# Patient Record
Sex: Male | Born: 1950 | Race: White | Hispanic: No | Marital: Single | Smoking: Never smoker
Health system: Southern US, Community
[De-identification: ages and names within clinical notes are randomized; demographics above are authoritative.]

## PROBLEM LIST (undated history)

## (undated) DIAGNOSIS — F329 Major depressive disorder, single episode, unspecified: Secondary | ICD-10-CM

## (undated) DIAGNOSIS — Z923 Personal history of irradiation: Secondary | ICD-10-CM

## (undated) DIAGNOSIS — G4700 Insomnia, unspecified: Principal | ICD-10-CM

## (undated) DIAGNOSIS — C099 Malignant neoplasm of tonsil, unspecified: Secondary | ICD-10-CM

## (undated) DIAGNOSIS — F32A Depression, unspecified: Secondary | ICD-10-CM

## (undated) HISTORY — DX: Insomnia, unspecified: G47.00

## (undated) HISTORY — DX: Malignant neoplasm of tonsil, unspecified: C09.9

## (undated) HISTORY — DX: Personal history of irradiation: Z92.3

---

## 1999-07-13 HISTORY — PX: HERNIA REPAIR: SHX51

## 2014-03-06 ENCOUNTER — Other Ambulatory Visit: Payer: Self-pay | Admitting: Otolaryngology

## 2014-03-06 DIAGNOSIS — R220 Localized swelling, mass and lump, head: Secondary | ICD-10-CM

## 2014-03-06 DIAGNOSIS — R221 Localized swelling, mass and lump, neck: Principal | ICD-10-CM

## 2014-03-08 ENCOUNTER — Other Ambulatory Visit: Payer: Self-pay | Admitting: Family Medicine

## 2014-03-08 ENCOUNTER — Ambulatory Visit
Admission: RE | Admit: 2014-03-08 | Discharge: 2014-03-08 | Disposition: A | Discharge status: Home / Self Care | Payer: BC Managed Care – PPO | Source: Ambulatory Visit | Attending: Family Medicine | Admitting: Family Medicine

## 2014-03-08 DIAGNOSIS — M25421 Effusion, right elbow: Secondary | ICD-10-CM

## 2014-03-15 ENCOUNTER — Ambulatory Visit
Admission: RE | Admit: 2014-03-15 | Discharge: 2014-03-15 | Disposition: A | Discharge status: Home / Self Care | Payer: BC Managed Care – PPO | Source: Ambulatory Visit | Attending: Otolaryngology | Admitting: Otolaryngology

## 2014-03-15 DIAGNOSIS — R221 Localized swelling, mass and lump, neck: Principal | ICD-10-CM

## 2014-03-15 DIAGNOSIS — R220 Localized swelling, mass and lump, head: Secondary | ICD-10-CM

## 2014-03-15 MED ORDER — IOHEXOL 300 MG/ML  SOLN
75.0000 mL | Freq: Once | INTRAMUSCULAR | Status: AC | PRN
  Administered 2014-03-15: 75 mL via INTRAVENOUS

## 2014-03-20 ENCOUNTER — Other Ambulatory Visit: Payer: Self-pay | Admitting: Otolaryngology

## 2014-03-20 ENCOUNTER — Other Ambulatory Visit (HOSPITAL_COMMUNITY)
Admission: RE | Admit: 2014-03-20 | Discharge: 2014-03-20 | Disposition: A | Discharge status: Home / Self Care | Payer: BC Managed Care – PPO | Source: Ambulatory Visit | Attending: Otolaryngology | Admitting: Otolaryngology

## 2014-03-20 DIAGNOSIS — R221 Localized swelling, mass and lump, neck: Secondary | ICD-10-CM | POA: Diagnosis not present

## 2014-03-20 DIAGNOSIS — R220 Localized swelling, mass and lump, head: Secondary | ICD-10-CM | POA: Diagnosis present

## 2014-03-21 ENCOUNTER — Other Ambulatory Visit (HOSPITAL_COMMUNITY): Payer: Self-pay | Admitting: Otolaryngology

## 2014-03-21 DIAGNOSIS — R590 Localized enlarged lymph nodes: Secondary | ICD-10-CM

## 2014-03-21 DIAGNOSIS — R599 Enlarged lymph nodes, unspecified: Secondary | ICD-10-CM

## 2014-03-23 ENCOUNTER — Encounter (HOSPITAL_COMMUNITY): Admission: RE | Admit: 2014-03-23 | Payer: BC Managed Care – PPO | Source: Ambulatory Visit

## 2014-03-29 ENCOUNTER — Encounter (HOSPITAL_COMMUNITY)
Admission: RE | Admit: 2014-03-29 | Discharge: 2014-03-29 | Disposition: A | Discharge status: Home / Self Care | Payer: BC Managed Care – PPO | Source: Ambulatory Visit | Attending: Otolaryngology | Admitting: Otolaryngology

## 2014-03-29 DIAGNOSIS — R599 Enlarged lymph nodes, unspecified: Secondary | ICD-10-CM | POA: Diagnosis present

## 2014-03-29 DIAGNOSIS — R590 Localized enlarged lymph nodes: Secondary | ICD-10-CM

## 2014-03-29 LAB — GLUCOSE, CAPILLARY: GLUCOSE-CAPILLARY: 89 mg/dL (ref 70–99)

## 2014-03-29 MED ORDER — FLUDEOXYGLUCOSE F - 18 (FDG) INJECTION
9.7000 | Freq: Once | INTRAVENOUS | Status: AC | PRN
  Administered 2014-03-29: 9.7 via INTRAVENOUS

## 2014-04-11 DIAGNOSIS — C099 Malignant neoplasm of tonsil, unspecified: Secondary | ICD-10-CM

## 2014-04-11 HISTORY — DX: Malignant neoplasm of tonsil, unspecified: C09.9

## 2014-04-12 ENCOUNTER — Other Ambulatory Visit (HOSPITAL_COMMUNITY): Payer: Self-pay | Admitting: Otolaryngology

## 2014-04-12 ENCOUNTER — Encounter (HOSPITAL_COMMUNITY): Payer: Self-pay

## 2014-04-12 ENCOUNTER — Encounter (HOSPITAL_COMMUNITY)
Admission: RE | Admit: 2014-04-12 | Discharge: 2014-04-12 | Disposition: A | Discharge status: Home / Self Care | Payer: BC Managed Care – PPO | Source: Ambulatory Visit | Attending: Otolaryngology | Admitting: Otolaryngology

## 2014-04-12 DIAGNOSIS — R221 Localized swelling, mass and lump, neck: Secondary | ICD-10-CM | POA: Diagnosis present

## 2014-04-12 DIAGNOSIS — Z01812 Encounter for preprocedural laboratory examination: Secondary | ICD-10-CM | POA: Insufficient documentation

## 2014-04-12 HISTORY — DX: Depression, unspecified: F32.A

## 2014-04-12 HISTORY — DX: Major depressive disorder, single episode, unspecified: F32.9

## 2014-04-12 LAB — CBC
HCT: 37.7 % — ABNORMAL LOW (ref 39.0–52.0)
Hemoglobin: 13.2 g/dL (ref 13.0–17.0)
MCH: 33.1 pg (ref 26.0–34.0)
MCHC: 35 g/dL (ref 30.0–36.0)
MCV: 94.5 fL (ref 78.0–100.0)
Platelets: 189 10*3/uL (ref 150–400)
RBC: 3.99 MIL/uL — AB (ref 4.22–5.81)
RDW: 11.9 % (ref 11.5–15.5)
WBC: 5.7 10*3/uL (ref 4.0–10.5)

## 2014-04-12 NOTE — Progress Notes (Signed)
Pt's PCP is Dr. Dorthy Cooler at [facility].

## 2014-04-12 NOTE — Progress Notes (Signed)
No pre-op orders in EPIC. Called Dr. Victorio Palm office to request orders, spoke with [facility]

## 2014-04-12 NOTE — Pre-Procedure Instructions (Signed)
[name]  04/12/2014   Your procedure is scheduled on:  Friday, April 19, 2014 at 7:30 AM.   Report to [facility] Entrance "A" Admitting Office at 5:30 AM.   Call this number if you have problems the morning of surgery: [phone]   Remember:   Do not eat food or drink liquids after midnight Thursday, 04/18/14.   Take these medicines the morning of surgery with A SIP OF WATER: NONE  Stop Ibuprofen as of today   Do not wear jewelry.  Do not wear lotions, powders, or cologne. You may wear deodorant.  Men may shave face and neck.  Do not bring valuables to the hospital.  [facility] is not responsible                  for any belongings or valuables.               Contacts, dentures or bridgework may not be worn into surgery.  Leave suitcase in the car. After surgery it may be brought to your room.  For patients admitted to the hospital, discharge time is determined by your                treatment team.               Patients discharged the day of surgery will not be allowed to drive home.    Special Instructions: [place] - Preparing for Surgery  Before surgery, you can play an important role.  Because skin is not sterile, your skin needs to be as free of germs as possible.  You can reduce the number of germs on you skin by washing with CHG (chlorahexidine gluconate) soap before surgery.  CHG is an antiseptic cleaner which kills germs and bonds with the skin to continue killing germs even after washing.  Please DO NOT use if you have an allergy to CHG or antibacterial soaps.  If your skin becomes reddened/irritated stop using the CHG and inform your nurse when you arrive at Short Stay.  Do not shave (including legs and underarms) for at least 48 hours prior to the first CHG shower.  You may shave your face.  Please follow these instructions carefully:   1.  Shower with CHG Soap the night before surgery and the                                morning of  Surgery.  2.  If you choose to wash your hair, wash your hair first as usual with your       normal shampoo.  3.  After you shampoo, rinse your hair and body thoroughly to remove the                      Shampoo.  4.  Use CHG as you would any other liquid soap.  You can apply chg directly       to the skin and wash gently with scrungie or a clean washcloth.  5.  Apply the CHG Soap to your body ONLY FROM THE NECK DOWN.        Do not use on open wounds or open sores.  Avoid contact with your eyes, ears, mouth and genitals (private parts).  Wash genitals (private parts) with your normal soap.  6.  Wash thoroughly, paying special attention to the area where your surgery  will be performed.  7.  Thoroughly rinse your body with warm water from the neck down.  8.  DO NOT shower/wash with your normal soap after using and rinsing off       the CHG Soap.  9.  Pat yourself dry with a clean towel.            10.  Wear clean pajamas.            11.  Place clean sheets on your bed the night of your first shower and do not        sleep with pets.  Day of Surgery  Do not apply any lotions the morning of surgery.  Please wear clean clothes to the hospital/surgery center.     Please read over the following fact sheets that you were given: Pain Booklet, Coughing and Deep Breathing and Surgical Site Infection Prevention

## 2014-04-19 ENCOUNTER — Encounter (HOSPITAL_COMMUNITY)
Admission: RE | Disposition: A | Discharge status: Home / Self Care | Payer: Self-pay | Source: Ambulatory Visit | Attending: Otolaryngology

## 2014-04-19 ENCOUNTER — Ambulatory Visit (HOSPITAL_COMMUNITY): Payer: BC Managed Care – PPO | Admitting: Certified Registered"

## 2014-04-19 ENCOUNTER — Ambulatory Visit (HOSPITAL_COMMUNITY)
Admission: RE | Admit: 2014-04-19 | Discharge: 2014-04-19 | Disposition: A | Discharge status: Home / Self Care | Payer: BC Managed Care – PPO | Source: Ambulatory Visit | Attending: Otolaryngology | Admitting: Otolaryngology

## 2014-04-19 ENCOUNTER — Encounter (HOSPITAL_COMMUNITY): Payer: Self-pay | Admitting: *Deleted

## 2014-04-19 ENCOUNTER — Encounter (HOSPITAL_COMMUNITY): Payer: BC Managed Care – PPO | Admitting: Certified Registered"

## 2014-04-19 DIAGNOSIS — C099 Malignant neoplasm of tonsil, unspecified: Secondary | ICD-10-CM | POA: Diagnosis not present

## 2014-04-19 DIAGNOSIS — F1210 Cannabis abuse, uncomplicated: Secondary | ICD-10-CM | POA: Insufficient documentation

## 2014-04-19 DIAGNOSIS — R221 Localized swelling, mass and lump, neck: Secondary | ICD-10-CM | POA: Diagnosis present

## 2014-04-19 DIAGNOSIS — F329 Major depressive disorder, single episode, unspecified: Secondary | ICD-10-CM | POA: Diagnosis not present

## 2014-04-19 HISTORY — PX: TONSILLECTOMY: SHX5217

## 2014-04-19 HISTORY — PX: DIRECT LARYNGOSCOPY: SHX5326

## 2014-04-19 SURGERY — LARYNGOSCOPY, DIRECT
Anesthesia: General | Site: Mouth | Laterality: Right

## 2014-04-19 MED ORDER — OXYCODONE HCL 5 MG PO TABS: 5.0000 mg | ORAL_TABLET | Freq: Once | ORAL | Status: DC | PRN

## 2014-04-19 MED ORDER — PROPOFOL 10 MG/ML IV BOLUS
INTRAVENOUS | Status: AC
  Filled 2014-04-19: qty 20

## 2014-04-19 MED ORDER — HYDROCODONE-ACETAMINOPHEN 7.5-325 MG/15ML PO SOLN: 10.0000 mL | ORAL | Status: DC | PRN

## 2014-04-19 MED ORDER — ONDANSETRON HCL 4 MG/2ML IJ SOLN
INTRAMUSCULAR | Status: AC
  Filled 2014-04-19: qty 2

## 2014-04-19 MED ORDER — GLYCOPYRROLATE 0.2 MG/ML IJ SOLN
INTRAMUSCULAR | Status: DC | PRN
  Administered 2014-04-19: 0.4 mg via INTRAVENOUS

## 2014-04-19 MED ORDER — DEXAMETHASONE SODIUM PHOSPHATE 4 MG/ML IJ SOLN
INTRAMUSCULAR | Status: DC | PRN
  Administered 2014-04-19: 8 mg via INTRAVENOUS

## 2014-04-19 MED ORDER — SODIUM CHLORIDE 0.9 % IJ SOLN
INTRAMUSCULAR | Status: AC
  Filled 2014-04-19: qty 10

## 2014-04-19 MED ORDER — ONDANSETRON HCL 4 MG/2ML IJ SOLN: 4.0000 mg | Freq: Once | INTRAMUSCULAR | Status: DC | PRN

## 2014-04-19 MED ORDER — PROPOFOL 10 MG/ML IV BOLUS
INTRAVENOUS | Status: DC | PRN
  Administered 2014-04-19: 180 mg via INTRAVENOUS

## 2014-04-19 MED ORDER — ONDANSETRON HCL 4 MG/2ML IJ SOLN
INTRAMUSCULAR | Status: DC | PRN
Start: 2014-04-19 — End: 2014-04-19
  Administered 2014-04-19: 4 mg via INTRAVENOUS

## 2014-04-19 MED ORDER — HYDROMORPHONE HCL 1 MG/ML IJ SOLN: 0.2500 mg | INTRAMUSCULAR | Status: DC | PRN

## 2014-04-19 MED ORDER — GLYCOPYRROLATE 0.2 MG/ML IJ SOLN
INTRAMUSCULAR | Status: AC
  Filled 2014-04-19: qty 2

## 2014-04-19 MED ORDER — ROCURONIUM BROMIDE 100 MG/10ML IV SOLN
INTRAVENOUS | Status: DC | PRN
  Administered 2014-04-19: 50 mg via INTRAVENOUS

## 2014-04-19 MED ORDER — DEXAMETHASONE SODIUM PHOSPHATE 4 MG/ML IJ SOLN
INTRAMUSCULAR | Status: AC
  Filled 2014-04-19: qty 2

## 2014-04-19 MED ORDER — LIDOCAINE HCL (CARDIAC) 20 MG/ML IV SOLN
INTRAVENOUS | Status: AC
  Filled 2014-04-19: qty 5

## 2014-04-19 MED ORDER — CEFAZOLIN SODIUM-DEXTROSE 2-3 GM-% IV SOLR
2000.0000 mg | Freq: Once | INTRAVENOUS | Status: AC
  Administered 2014-04-19: 2 g via INTRAVENOUS
  Filled 2014-04-19: qty 50

## 2014-04-19 MED ORDER — MIDAZOLAM HCL 2 MG/2ML IJ SOLN
INTRAMUSCULAR | Status: AC
  Filled 2014-04-19: qty 2

## 2014-04-19 MED ORDER — ROCURONIUM BROMIDE 50 MG/5ML IV SOLN
INTRAVENOUS | Status: AC
  Filled 2014-04-19: qty 1

## 2014-04-19 MED ORDER — EPINEPHRINE HCL (NASAL) 0.1 % NA SOLN
NASAL | Status: AC
  Filled 2014-04-19: qty 30

## 2014-04-19 MED ORDER — NEOSTIGMINE METHYLSULFATE 10 MG/10ML IV SOLN
INTRAVENOUS | Status: DC | PRN
  Administered 2014-04-19: 3 mg via INTRAVENOUS

## 2014-04-19 MED ORDER — OXYCODONE HCL 5 MG/5ML PO SOLN: 5.0000 mg | Freq: Once | ORAL | Status: DC | PRN

## 2014-04-19 MED ORDER — LACTATED RINGERS IV SOLN
INTRAVENOUS | Status: DC | PRN
  Administered 2014-04-19: 07:00:00 via INTRAVENOUS

## 2014-04-19 MED ORDER — LIDOCAINE HCL (CARDIAC) 20 MG/ML IV SOLN
INTRAVENOUS | Status: DC | PRN
  Administered 2014-04-19: 30 mg via INTRAVENOUS

## 2014-04-19 MED ORDER — SUCCINYLCHOLINE CHLORIDE 20 MG/ML IJ SOLN
INTRAMUSCULAR | Status: AC
  Filled 2014-04-19: qty 1

## 2014-04-19 MED ORDER — CEFAZOLIN SODIUM-DEXTROSE 2-3 GM-% IV SOLR
INTRAVENOUS | Status: AC
  Filled 2014-04-19: qty 50

## 2014-04-19 MED ORDER — AMOXICILLIN-POT CLAVULANATE 250-62.5 MG/5ML PO SUSR: 10.0000 mL | Freq: Two times a day (BID) | ORAL | Status: DC

## 2014-04-19 MED ORDER — MIDAZOLAM HCL 5 MG/5ML IJ SOLN
INTRAMUSCULAR | Status: DC | PRN
  Administered 2014-04-19: 2 mg via INTRAVENOUS

## 2014-04-19 MED ORDER — SUFENTANIL CITRATE 50 MCG/ML IV SOLN
INTRAVENOUS | Status: DC | PRN
  Administered 2014-04-19: 20 ug via INTRAVENOUS

## 2014-04-19 MED ORDER — NEOSTIGMINE METHYLSULFATE 10 MG/10ML IV SOLN
INTRAVENOUS | Status: AC
  Filled 2014-04-19: qty 1

## 2014-04-19 MED ORDER — 0.9 % SODIUM CHLORIDE (POUR BTL) OPTIME
TOPICAL | Status: DC | PRN
  Administered 2014-04-19: 1000 mL

## 2014-04-19 MED ORDER — SUFENTANIL CITRATE 50 MCG/ML IV SOLN
INTRAVENOUS | Status: AC
  Filled 2014-04-19: qty 1

## 2014-04-19 SURGICAL SUPPLY — 38 items
BALLN PULM 15 16.5 18X75 (BALLOONS)
BALLOON PULM 15 16.5 18X75 (BALLOONS) IMPLANT
CANISTER SUCTION 2500CC (MISCELLANEOUS) ×3 IMPLANT
CATH ROBINSON RED A/P 12FR (CATHETERS) IMPLANT
CLEANER TIP ELECTROSURG 2X2 (MISCELLANEOUS) ×3 IMPLANT
COAGULATOR SUCT SWTCH 10FR 6 (ELECTROSURGICAL) ×3 IMPLANT
CONT SPEC 4OZ CLIKSEAL STRL BL (MISCELLANEOUS) ×6 IMPLANT
COVER MAYO STAND STRL (DRAPES) IMPLANT
COVER TABLE BACK 60X90 (DRAPES) IMPLANT
DRAPE PROXIMA HALF (DRAPES) ×3 IMPLANT
DRSG TELFA 3X8 NADH (GAUZE/BANDAGES/DRESSINGS) ×3 IMPLANT
ELECT COATED BLADE 2.86 ST (ELECTRODE) ×3 IMPLANT
ELECT REM PT RETURN 9FT ADLT (ELECTROSURGICAL) ×3
ELECT REM PT RETURN 9FT PED (ELECTROSURGICAL)
ELECTRODE REM PT RETRN 9FT PED (ELECTROSURGICAL) IMPLANT
ELECTRODE REM PT RTRN 9FT ADLT (ELECTROSURGICAL) ×2 IMPLANT
GAUZE SPONGE 4X4 16PLY XRAY LF (GAUZE/BANDAGES/DRESSINGS) ×3 IMPLANT
GLOVE BIOGEL M 7.0 STRL (GLOVE) ×6 IMPLANT
GLOVE SURG SS PI 7.0 STRL IVOR (GLOVE) ×6 IMPLANT
GOWN STRL REUS W/ TWL LRG LVL3 (GOWN DISPOSABLE) ×4 IMPLANT
GOWN STRL REUS W/TWL LRG LVL3 (GOWN DISPOSABLE) ×2
GUARD TEETH (MISCELLANEOUS) ×3 IMPLANT
KIT BASIN OR (CUSTOM PROCEDURE TRAY) ×3 IMPLANT
KIT ROOM TURNOVER OR (KITS) ×3 IMPLANT
MARKER SKIN DUAL TIP RULER LAB (MISCELLANEOUS) IMPLANT
NEEDLE 18GX1X1/2 (RX/OR ONLY) (NEEDLE) ×3 IMPLANT
NS IRRIG 1000ML POUR BTL (IV SOLUTION) ×3 IMPLANT
PACK SURGICAL SETUP 50X90 (CUSTOM PROCEDURE TRAY) ×3 IMPLANT
PAD ARMBOARD 7.5X6 YLW CONV (MISCELLANEOUS) ×6 IMPLANT
PATTIES SURGICAL .5 X3 (DISPOSABLE) IMPLANT
PENCIL BUTTON HOLSTER BLD 10FT (ELECTRODE) ×3 IMPLANT
SPONGE TONSIL 1 RF SGL (DISPOSABLE) ×3 IMPLANT
SURGILUBE 2OZ TUBE FLIPTOP (MISCELLANEOUS) IMPLANT
SYR BULB 3OZ (MISCELLANEOUS) ×3 IMPLANT
TOWEL OR 17X24 6PK STRL BLUE (TOWEL DISPOSABLE) ×6 IMPLANT
TUBE CONNECTING 12X1/4 (SUCTIONS) ×3 IMPLANT
TUBE SALEM SUMP 16 FR W/ARV (TUBING) ×3 IMPLANT
WATER STERILE IRR 1000ML POUR (IV SOLUTION) ×3 IMPLANT

## 2014-04-19 NOTE — Brief Op Note (Signed)
04/19/2014  8:26 AM  PATIENT:  [name]  63 y.o. male  PRE-OPERATIVE DIAGNOSIS:  RIGHT NECK MASS  POST-OPERATIVE DIAGNOSIS:  RIGHT NECK MASS  PROCEDURE:  Procedure(s): DIRECT LARYNGOSCOPY AND BIOPSY (N/A) RIGHT SIDE TONSILLECTOMY (Right)  SURGEON:  Surgeon(s) and Role:    * Jerrell Belfast, MD - Primary  PHYSICIAN ASSISTANT:   ASSISTANTS: none   ANESTHESIA:   general  EBL:     BLOOD ADMINISTERED:none  DRAINS: none   LOCAL MEDICATIONS USED:  NONE  SPECIMEN:  Source of Specimen:  Rt BOT and Tonsil  DISPOSITION OF SPECIMEN:  PATHOLOGY  COUNTS:  YES  TOURNIQUET:  * No tourniquets in log *  DICTATION: .Other Dictation: Dictation Number [id number]  PLAN OF CARE: Discharge to home after PACU  PATIENT DISPOSITION:  PACU - hemodynamically stable.   Delay start of Pharmacological VTE agent (>24hrs) due to surgical blood loss or risk of bleeding: not applicable

## 2014-04-19 NOTE — Anesthesia Postprocedure Evaluation (Signed)
  Anesthesia Post-op Note  Patient: [name]  Procedure(s) Performed: Procedure(s): DIRECT LARYNGOSCOPY AND BIOPSY (N/A) RIGHT SIDE TONSILLECTOMY (Right)  Patient Location: PACU  Anesthesia Type:General  Level of Consciousness: awake, alert  and oriented  Airway and Oxygen Therapy: Patient Spontanous Breathing  Post-op Pain: mild  Post-op Assessment: Post-op Vital signs reviewed, Patient's Cardiovascular Status Stable, Respiratory Function Stable, Patent Airway and Pain level controlled  Post-op Vital Signs: stable  Last Vitals:  Filed Vitals:   04/19/14 0922  BP: 137/75  Pulse: 53  Temp:   Resp: 15    Complications: No apparent anesthesia complications

## 2014-04-19 NOTE — Anesthesia Procedure Notes (Signed)
Procedure Name: Intubation Date/Time: 04/19/2014 7:46 AM Performed by: Melina Copa, Nhu Glasby R Pre-anesthesia Checklist: Patient identified, Emergency Drugs available, Suction available, Patient being monitored and Timeout performed Patient Re-evaluated:Patient Re-evaluated prior to inductionOxygen Delivery Method: Circle system utilized Preoxygenation: Pre-oxygenation with 100% oxygen Intubation Type: IV induction Ventilation: Mask ventilation without difficulty Laryngoscope Size: Mac and 4 Grade View: Grade I Tube type: Oral Tube size: 7.0 mm Number of attempts: 1 Airway Equipment and Method: Stylet Placement Confirmation: ETT inserted through vocal cords under direct vision,  positive ETCO2 and breath sounds checked- equal and bilateral Secured at: 21 cm Tube secured with: Tape Dental Injury: Teeth and Oropharynx as per pre-operative assessment

## 2014-04-19 NOTE — Op Note (Signed)
[name], [name]              ACCOUNT NO.:  [id number]  MEDICAL RECORD NO.:  [id number]  LOCATION:  [facility]                         FACILITY:  [facility]  PHYSICIAN:  Early Chars. Wilburn Cornelia, M.D.DATE OF BIRTH:  05/22/1951  DATE OF PROCEDURE:  04/19/2014 DATE OF DISCHARGE:                              OPERATIVE REPORT   PREOPERATIVE DIAGNOSIS:  Right superior neck mass suspicious for possible metastatic disease.  POSTOPERATIVE DIAGNOSIS:  Right superior neck mass suspicious for possible metastatic disease.  INDICATION FOR SURGERY:  Right superior neck mass suspicious for possible metastatic disease.  SURGICAL PROCEDURE:  Direct laryngoscopy, biopsy, and right tonsillectomy.  ANESTHESIA:  General endotracheal.  SURGEON:  Early Chars. Wilburn Cornelia, MD  COMPLICATIONS:  None.  ESTIMATED BLOOD LOSS:  Less than 50 mL.  The patient transferred from the operating room to the recovery room in stable condition.  BRIEF HISTORY:  The patient is a 63 year old white male, referred to our office for evaluation of an 8 month history of large right lateral neck mass.  Workup including fine-needle aspiration was suspicious for possible metastatic disease and PET scan was performed, which showed significant increased activity in the right lateral neck.  Given the history and findings, concerns raised regarding metastatic carcinoma, the patient was scheduled to undergo laryngoscopy with exam and biopsy as well as right tonsillectomy to identify a primary source for possible metastatic carcinoma.  The risks and benefits of the procedure were discussed in detail, and the patient understood and concurred with our plan for surgery which was scheduled on elective basis at [facility] on April 19, 2014.  .  DESCRIPTION OF PROCEDURE:  The patient was brought to the operating room, placed in supine position on the operating table.  General endotracheal anesthesia was established without difficulty.   When the patient was adequately anesthetized, he was positioned on the operating table and prepped and draped in sterile fashion.  Time-out was obtained and the patient was prepared for surgery,  A direct laryngoscopy was then performed.  A tooth guard was inserted. Dedo laryngoscope was used to examine the oral cavity, oropharynx, base of tongue, supraglottic larynx and hypopharynx.  There was no evidence of mucosal ulcer, mass or lesion.  Several cup forceps biopsies were obtained of the right vallecular lymphoid tissue and base of tongue, and these were sent to Pathology for gross and microscopic evaluation. There was minimal bleeding.  The oropharynx was irrigated and suctioned.  Tonsillectomy was then performed.  A Crowe-Davis mouth gag was inserted without difficulty.  There were no loose or broken teeth.  Dissection on the right-hand side was begun using Bovie electrocautery and dissecting in subcapsular fashion removing the entire right tonsil from superior pole to tongue base in a subcapsular fashion.  The tonsil appeared normal.  There was no evidence of mass or ulcer.  The tonsil was sent as a separate biopsy to Pathology for gross microscopic evaluation.  The tonsillar fossa was gently abraded with a dry tonsil sponge.  There were several small areas of point hemorrhage which were then cauterized with suction cautery.  The mouth gag was released and reapplied.  The patient's nasopharynx was then examined using a laryngeal mirror  for indirect evaluation.  There was minimal adenoidal tissue and no evidence of ulcer, tumor or mass.  The patient's mouth gag was released and reapplied with no further bleeding.  An orogastric tube was passed.  The stomach contents were aspirated.  The patient was then awakened from his anesthetic and mouth gag was removed.  He was extubated and transferred from the operating room to the recovery room in stable condition.  No complications.   Estimated blood loss less than 50 mL.          ______________________________ Early Chars. Wilburn Cornelia, M.D.     DLS/MEDQ  D:  00/51/1021  T:  04/19/2014  Job:  [id number]

## 2014-04-19 NOTE — Discharge Instructions (Signed)

## 2014-04-19 NOTE — Transfer of Care (Signed)
Immediate Anesthesia Transfer of Care Note  Patient: [name]  Procedure(s) Performed: Procedure(s): DIRECT LARYNGOSCOPY AND BIOPSY (N/A) RIGHT SIDE TONSILLECTOMY (Right)  Patient Location: PACU  Anesthesia Type:General  Level of Consciousness: awake, alert  and oriented  Airway & Oxygen Therapy: Patient Spontanous Breathing and Patient connected to nasal cannula oxygen  Post-op Assessment: Report given to PACU RN, Post -op Vital signs reviewed and stable and Patient moving all extremities  Post vital signs: Reviewed and stable  Complications: No apparent anesthesia complications

## 2014-04-19 NOTE — Anesthesia Preprocedure Evaluation (Addendum)
Anesthesia Evaluation  Patient identified by MRN, date of birth, ID band Patient awake    Reviewed: Allergy & Precautions, H&P , NPO status , Patient's Chart, lab work & pertinent test results  Airway Mallampati: II TM Distance: >3 FB Neck ROM: Full    Dental  (+) Teeth Intact, Dental Advisory Given   Pulmonary  breath sounds clear to auscultation        Cardiovascular Rhythm:Regular Rate:Normal     Neuro/Psych    GI/Hepatic   Endo/Other    Renal/GU      Musculoskeletal   Abdominal   Peds  Hematology   Anesthesia Other Findings   Reproductive/Obstetrics                           Anesthesia Physical Anesthesia Plan  ASA: II  Anesthesia Plan: General   Post-op Pain Management:    Induction: Intravenous  Airway Management Planned: Oral ETT  Additional Equipment: None  Intra-op Plan:   Post-operative Plan: Extubation in OR  Informed Consent: I have reviewed the patients History and Physical, chart, labs and discussed the procedure including the risks, benefits and alternatives for the proposed anesthesia with the patient or authorized representative who has indicated his/her understanding and acceptance.   Dental advisory given  Plan Discussed with: CRNA, Anesthesiologist and Surgeon  Anesthesia Plan Comments: (63 year old male R. Tonsillar mass  Plan GA with oral ETT  Roberts Gaudy)       Anesthesia Quick Evaluation

## 2014-04-19 NOTE — H&P (Signed)
[name] is an 63 y.o. male.   Chief Complaint: Right Neck Mass HPI: Hx of rt neck mass c/w met dz  Past Medical History  Diagnosis Date  . Depression     "years ago"    Past Surgical History  Procedure Laterality Date  . Hernia repair Left 2001    inguinal hernia    Family History  Problem Relation Age of Onset  . Dementia Mother    Social History:  reports that he has never smoked. He has never used smokeless tobacco. He reports that he drinks about 16.8 ounces of alcohol per week. He reports that he uses illicit drugs (Marijuana).  Allergies: No Known Allergies  Medications Prior to Admission  Medication Sig Dispense Refill  . ibuprofen (ADVIL,MOTRIN) 200 MG tablet Take 600 mg by mouth daily as needed (pain).        No results found for this or any previous visit (from the past 48 hour(s)). No results found.  Review of Systems  Constitutional: Negative.   HENT: Negative.   Respiratory: Negative.   Cardiovascular: Negative.   Gastrointestinal: Negative.     Blood pressure 156/78, pulse 53, temperature 98.2 F (36.8 C), temperature source Oral, resp. rate 20, height $RemoveBe'6\' 2"'DTJzrWyCu$  (1.88 m), weight 89.812 kg (198 lb), SpO2 100.00%. Physical Exam  Constitutional: He is oriented to person, place, and time. He appears well-developed and well-nourished.  Neck: Normal range of motion. Neck supple.  Cardiovascular: Normal rate.   Respiratory: Effort normal.  GI: Soft.  Musculoskeletal: Normal range of motion.  Lymphadenopathy:    He has cervical adenopathy.  Neurological: He is alert and oriented to person, place, and time.     Assessment/Plan Adm for OP DL and tonsillectomy  [name] 04/19/2014, 7:23 AM

## 2014-04-22 ENCOUNTER — Encounter (HOSPITAL_COMMUNITY): Payer: Self-pay | Admitting: Otolaryngology

## 2014-05-03 ENCOUNTER — Telehealth: Payer: Self-pay | Admitting: *Deleted

## 2014-05-03 NOTE — Telephone Encounter (Signed)
Called pt to introduce myself as the oncology nurse navigator that works with Dr. Isidore Moos, indicated that I would be joining him during his 05/08/14 consult appt with her, indicated I would tell him more about my role as a member of the Care Team when we meet. I confirmed his understanding of [place] location, arrival procedure, encouraged him to call me with any questions prior to next week's appt.  He verbalized understanding and expressed appreciation for my call.  Gayleen Orem, RN, BSN, [place] [facility] [place] [phone]

## 2014-05-07 ENCOUNTER — Encounter (HOSPITAL_COMMUNITY): Payer: Self-pay | Admitting: Dentistry

## 2014-05-07 ENCOUNTER — Other Ambulatory Visit: Payer: Self-pay | Admitting: *Deleted

## 2014-05-07 ENCOUNTER — Telehealth: Payer: Self-pay | Admitting: Hematology and Oncology

## 2014-05-07 ENCOUNTER — Ambulatory Visit (HOSPITAL_COMMUNITY): Payer: Self-pay | Admitting: Dentistry

## 2014-05-07 VITALS — BP 138/76 | HR 57 | Temp 98.0°F

## 2014-05-07 DIAGNOSIS — K0530 Chronic periodontitis, unspecified: Secondary | ICD-10-CM

## 2014-05-07 DIAGNOSIS — C099 Malignant neoplasm of tonsil, unspecified: Secondary | ICD-10-CM

## 2014-05-07 DIAGNOSIS — M264 Malocclusion, unspecified: Secondary | ICD-10-CM

## 2014-05-07 DIAGNOSIS — Z0189 Encounter for other specified special examinations: Secondary | ICD-10-CM

## 2014-05-07 DIAGNOSIS — K036 Deposits [accretions] on teeth: Secondary | ICD-10-CM

## 2014-05-07 DIAGNOSIS — K030 Excessive attrition of teeth: Secondary | ICD-10-CM

## 2014-05-07 DIAGNOSIS — M270 Developmental disorders of jaws: Secondary | ICD-10-CM

## 2014-05-07 DIAGNOSIS — K08101 Complete loss of teeth, unspecified cause, class I: Secondary | ICD-10-CM

## 2014-05-07 NOTE — Progress Notes (Addendum)
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 Extensive right cervical chain lymphadenopathy with malignant  range hypermetabolism, as above, compatible with the reported  clinical history of lymphoma.  2. There is also overall symmetric low-level hypermetabolism in the  region of the pharyngeal tonsils bilaterally. This is favored to be  physiologic, but additional lymphomatous involvement in this region  is not excluded.  Biopsies of Right Base of Tongue/Right Tonsil (if applicable) revealed:  63/3/35 Diagnosis 1. Tongue, biopsy, Right tongue base - BENIGN SQUAMOUS MUCOSA, SEE COMMENT. - NEGATIVE FOR DYSPLASIA OR MALIGNANCY. 2. Tonsil, Right - INVASIVE SQUAMOUS CELL CARCINOMA  Nutrition Status:  Weight changes: no  Swallowing status: normal  Plans, if any, for PEG tube: no  Tobacco/Marijuana/Snuff/ETOH use: He has never smoked. He has never used smokeless tobacco. He reports that he drinks about 16.8 ounces of alcohol per week. He reports that he uses illicit drugs (Marijuana).  Past/Anticipated interventions by otolaryngology, if any: Dr. Jerrell Belfast - Biopsy  Past/Anticipated interventions by medical oncology, if any: Dr. Heath Lark, 05/08/14 10:00 am appointment   Referrals yet, to any of the following?  Social Work? Through Epic due to distress screen of 5/10, patient states he does not feel he needs to be contacted at this point  Dentistry? Dr. Teena Dunk - 05/07/14  Swallowing therapy? Almyra Deforest 05/27/14  Nutrition? 05/15/14   Med/Onc? 04/28/14  PEG placement? no  SAFETY ISSUES:  Prior radiation? No  Pacemaker/ICD? No  Possible current pregnancy? Na  Is the patient on methotrexate? No  Current Complaints / other details:

## 2014-05-07 NOTE — Progress Notes (Signed)
Radiation Oncology         [phone] ________________________________  Initial outpatient Consultation  Name: [name] MRN: [id number]  Date: 05/08/2014  DOB: 19-May-1951  CC:Pcp Not In System  Jerrell Belfast, MD   REFERRING PHYSICIAN: Jerrell Belfast, MD  DIAGNOSIS: [id number] Right tonsil squamous cell carcinoma, HPV+ STAGE IVA    ICD-9-CM ICD-10-CM   1. Malignant neoplasm of tonsil 146.0 C09.9 Ambulatory referral to Social Work  2. Cancer of Right Tonsil 146.0 C09.9     HISTORY OF PRESENT ILLNESS::[name] is a 63 y.o. male who presented with mass of right neck which fluctuated back and forth in size for 6-8 months. He tried ABX in February with mild improvement. He does have a history of tonsillitis (bilaterally) several times in the past.  CT of neck on 9-4 showed bulky right neck adenopathy and the right lateral tonsil was slightly fuller than the left, but without definite tumoral enhancement.  PET (03-29-14) showed extensive right neck adenopathy and bilateral tonsil hypermetabolic activity.  Biopsy of Right Base of Tongue/Right Tonsil showed 04-19-14: Diagnosis  1. Tongue, biopsy, Right tongue base  - BENIGN SQUAMOUS MUCOSA, SEE COMMENT.  - NEGATIVE FOR DYSPLASIA OR MALIGNANCY.  2. Tonsil, Right  - INVASIVE SQUAMOUS CELL CARCINOMA  Per op note by Dr Wilburn Cornelia, right tonsil appeared grossly normal, and no evidence of mucosal tumor elsewhere. The invasive tumor demonstrated strong diffuse p16 immunostain expression.  He has seen Dr Enrique Sack with recommendations for extractions (based on anticipated RT dosing that I sent him), and the patient has been scheduled for this consult appointment with Dr. Loyal Gambler for today at 2:15 PM  Nutrition Status:  Weight changes: no  Swallowing status: normal  Tobacco/Marijuana/Snuff/ETOH use: He has never smoked tobacco. He has never used smokeless tobacco. He reports that he drinks about 16.8 ounces of alcohol per week.  He reports that he uses illicit drugs (Marijuana).  Only pain is some resolving pain from recent right tonsillectomy.  PREVIOUS RADIATION THERAPY: No  PAST MEDICAL HISTORY:  has a past medical history of Depression.    PAST SURGICAL HISTORY: Past Surgical History  Procedure Laterality Date  . Hernia repair Left 2001    inguinal hernia  . Direct laryngoscopy N/A 04/19/2014    Procedure: DIRECT LARYNGOSCOPY AND BIOPSY;  Surgeon: Jerrell Belfast, MD;  Location: [facility];  Service: ENT;  Laterality: N/A;  . Tonsillectomy Right 04/19/2014    Procedure: RIGHT SIDE TONSILLECTOMY;  Surgeon: Jerrell Belfast, MD;  Location: [facility];  Service: ENT;  Laterality: Right;    FAMILY HISTORY: family history includes Cancer in his brother; Dementia in his mother.  SOCIAL HISTORY:  reports that he has never smoked. He has never used smokeless tobacco. He reports that he drinks about 16.8 ounces of alcohol per week. He reports that he uses illicit drugs (Marijuana).  ALLERGIES: Review of patient's allergies indicates no known allergies.  MEDICATIONS:  Current Outpatient Prescriptions  Medication Sig Dispense Refill  . HYDROcodone-acetaminophen (NORCO/VICODIN) 5-325 MG per tablet Take 1 tablet by mouth every 6 (six) hours as needed for moderate pain.       No current facility-administered medications for this encounter.    REVIEW OF SYSTEMS:  Notable for that above.   PHYSICAL EXAM:  height is 6\' 2"  (1.88 m) and weight is 195 lb 9.6 oz (88.724 kg). His oral temperature is 98.9 F (37.2 C). His blood pressure is 125/74 and his pulse is 70. His respiration is 20.   General:  Alert and oriented, in no acute distress HEENT: Head is normocephalic. Pupils are equally round and reactive to light. Extraocular movements are intact. Oropharynx is unremarkable Neck:  Right neck bulky adenopathy, two large masses in levels II/III Heart: Regular in rate and rhythm with no murmurs, rubs, or gallops. Chest: Clear to  auscultation bilaterally, with no rhonchi, wheezes, or rales. Abdomen: Soft, nontender, nondistended, with no rigidity or guarding. Extremities: No cyanosis or edema. Lymphatics: see neck Skin: No concerning lesions. Musculoskeletal: symmetric strength and muscle tone throughout. Neurologic: Cranial nerves II through XII are grossly intact. No obvious focalities. Speech is fluent. Coordination is intact. Psychiatric: Judgment and insight are intact. Affect is appropriate.   ECOG = 0  0 - Asymptomatic (Fully active, able to carry on all predisease activities without restriction)  1 - Symptomatic but completely ambulatory (Restricted in physically strenuous activity but ambulatory and able to carry out work of a light or sedentary nature. For example, light housework, office work)  2 - Symptomatic, <50% in bed during the day (Ambulatory and capable of all self care but unable to carry out any work activities. Up and about more than 50% of waking hours)  3 - Symptomatic, >50% in bed, but not bedbound (Capable of only limited self-care, confined to bed or chair 50% or more of waking hours)  4 - Bedbound (Completely disabled. Cannot carry on any self-care. Totally confined to bed or chair)  5 - Death   Eustace Pen MM, Creech RH, Tormey DC, et al. [phone]). "Toxicity and response criteria of the [facility]". [place] Oncol. 5 (6): 649-55   LABORATORY DATA:  Lab Results  Component Value Date   WBC 5.7 04/12/2014   HGB 13.2 04/12/2014   HCT 37.7* 04/12/2014   MCV 94.5 04/12/2014   PLT 189 04/12/2014   CMP  No results found for this basename: na,  k,  cl,  co2,  glucose,  bun,  creatinine,  calcium,  prot,  albumin,  ast,  alt,  alkphos,  bilitot,  gfrnonaa,  gfraa      No results found for this basename: TSH      RADIOGRAPHY: No results found.    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  ETOH 3-4 beers daily.  HPV strongly +. I think he is a good candidate for concurrent chemo radiotherapy. Plan is as below:  1) To see med/onc to discuss chemotherapy today  2) Patient to see dentistry for dental evaluation/extractions if needed, counseling, and scatter guards  3) Will see social work for social support (at multidisciplinary clinic next week)  4) Will see nutrition for nutrition support  (at multidisciplinary clinic next week)  5) Will defer to Dr Alvy Bimler re: referral for PEG tube; he understands the benefits of this which we discussed in detail and is willing to proceed  6) Will refer to swallowing therapy for dysphagia prevention  7) PT to see him for risk of neck edema, deconditioning form ChRT (at multidisciplinary clinic next week)  8) Simulation ASAP, to provide dose estimates for Dr Loyal Gambler DDS.  Anticipate 7 weeks of RT - 70 Gy in 35 fractions.    9) Advised him to abstain from smoking marijuana and from drinking ETOH. While I suspect his cancer was caused by HPV, these other carcinogenic factors may have further potentiated his cancer.  He  is motivated to abstain. He knows to contact us if he needs help with this.  10) Baseline TSH.  It was a pleasure meeting the patient today. We discussed the risks, benefits, and side effects of radiotherapy. He understands salvage neck dissection can be considered if he has residual disease after treatment.  We talked in detail about acute and late effects. He understands that some of the most bothersome acute effects will be significant soreness of the mouth and throat, changes in taste, changes in salivary function, skin irritation, hair loss, dehydration, weight loss and fatigue. We talked about late effects which include but are not necessarily limited to dysphagia, hypothyroidism, dry mouth, trismus, and neck edema. No guarantees of treatment were given. A consent form  was signed and placed in the patient's medical record. The patient is enthusiastic about proceeding with treatment. I look forward to participating in the patient's care.    __________________________________________   Eppie Gibson, MD

## 2014-05-07 NOTE — Progress Notes (Signed)
DENTAL CONSULTATION  Date of Consultation:  05/07/2014 Patient Name:   [name] Date of Birth:   1951/01/26 Medical Record Number: [id number]  VITALS: BP 138/76  Pulse 57  Temp(Src) 98 F (36.7 C) (Oral)  CHIEF COMPLAINT: Patient is referred for a medically necessary pre-chemoradiation therapy dental protocol examination by Dr. Isidore Moos.  HPI: [name] is a 63 year old male recently diagnosed with squamous cell carcinoma of the right tonsil. Patient with anticipated chemoradiation therapy. Patient is now seen as part of a medically necessary pre-chemoradiation therapy dental protocol examination.  The patient currently denies acute toothaches, swellings, or abscesses. Patient was last seen by his primary dentist, Dr. Theda Belfast, for an exam and cleaning in February 2015. Patient sees the dentist on an every 6 month basis. Patient has been seeing Dr. Theda Belfast for the past 2-3 years. Patient previously had been obtaining dental treatment in [place], [place]. Patient moved to [facility] in March 2013. Patient indicates that he had implants placed approximately 4 and 8 years ago respectively. Patient without problems with his implant therapy.  PROBLEM LIST: Patient Active Problem List   Diagnosis Date Noted  . Cancer of Right Tonsil 05/07/2014  . Mass of right side of neck 04/19/2014    PMH: Past Medical History  Diagnosis Date  . Depression     "years ago"    PSH: Past Surgical History  Procedure Laterality Date  . Hernia repair Left 2001    inguinal hernia  . Direct laryngoscopy N/A 04/19/2014    Procedure: DIRECT LARYNGOSCOPY AND BIOPSY;  Surgeon: Jerrell Belfast, MD;  Location: [facility];  Service: ENT;  Laterality: N/A;  . Tonsillectomy Right 04/19/2014    Procedure: RIGHT SIDE TONSILLECTOMY;  Surgeon: Jerrell Belfast, MD;  Location: [facility];  Service: ENT;  Laterality: Right;    ALLERGIES: No Known Allergies  MEDICATIONS: Current Outpatient Prescriptions   Medication Sig Dispense Refill  . amoxicillin-clavulanate (AUGMENTIN) 250-62.5 MG/5ML suspension Take 10 mLs by mouth 2 (two) times daily.  200 mL  0  . HYDROcodone-acetaminophen (HYCET) 7.5-325 mg/15 ml solution Take 10-15 mLs by mouth every 4 (four) hours as needed for moderate pain.  473 mL  0   No current facility-administered medications for this visit.    LABS: Lab Results  Component Value Date   WBC 5.7 04/12/2014   HGB 13.2 04/12/2014   HCT 37.7* 04/12/2014   MCV 94.5 04/12/2014   PLT 189 04/12/2014   No results found for this basename: na, k, cl, co2, glucose, bun, creatinine, calcium, gfrnonaa, gfraa   No results found for this basename: INR, PROTIME   No results found for this basename: PTT    SOCIAL HISTORY: History   Social History  . Marital Status: Single    Spouse Name: N/A    Number of Children: N/A  . Years of Education: N/A   Occupational History  . Not on file.   Social History Main Topics  . Smoking status: Never Smoker   . Smokeless tobacco: Never Used  . Alcohol Use: 16.8 oz/week    28 Cans of beer per week  . Drug Use: Yes    Special: Marijuana  . Sexual Activity: Not on file   Other Topics Concern  . Not on file   Social History Narrative  . No narrative on file    FAMILY HISTORY: Family History  Problem Relation Age of Onset  . Dementia Mother     REVIEW OF SYSTEMS: Reviewed with the patient and included in  the dental record.  DENTAL HISTORY: CHIEF COMPLAINT: Patient is referred for a pre-chemoradiation therapy dental protocol examination by Dr. Isidore Moos.  HPI: [name] is a 63 year old male recently diagnosed with squamous cell carcinoma of the right tonsil. Patient with anticipated chemoradiation therapy. Patient is now seen as part of a medically necessary pre-chemoradiation therapy dental protocol examination.  The patient currently denies acute toothaches, swellings, or abscesses. Patient was last seen by his primary  dentist, Dr. Theda Belfast, for an exam and cleaning in February 2015. Patient sees the dentist on an every 6 month basis. Patient has been seeing Dr. Theda Belfast for the past 2-3 years. Patient previously had been obtaining dental treatment in [place], [place]. Patient moved to [facility] in March 2013. Patient indicates that he had implants placed approximately 4 and 8 years ago respectively. Patient without problems with his implant therapy.  DENTAL EXAMINATION: GENERAL: The patient is well-developed, well-nourished male in no acute distress. HEAD AND NECK: Patient has significant right neck lymphadenopathy. The patient has no left neck lymphadenopathy. The patient denies acute TMJ symptoms. The patient has a maximum interincisal opening of  50 mm. INTRAORAL EXAM:The patient has normal saliva. The patient has bilateral mandibular lingual tori. This patient has a palatal torus. There is no evidence of oral abscess formation.  DENTITION: The patient is missing tooth numbers 1, 5, 8, 12, 16, 17, 18, 31, and 32. There are now implants in the area of tooth numbers 5 and 12. The patient has a PFM bridge replacing tooth #8. The patient has mandibular anterior attrition.  PERIODONTAL: Patient has chronic periodontitis with plaque and calculus accumulations, selective areas of gingival recession and no obvious tooth mobility. There is incipient bone loss noted. DENTAL CARIES/SUBOPTIMAL RESTORATIONS: There are no obvious dental caries noted. Multiple areas of mandibular anterior attrition are noted. CROWN AND BRIDGE:The patient has PFM crowns on implants numbers 5 and 12. The patient has a bridge from tooth #7 through 9 replacing tooth #8. The patient has gold crowns and multiple gold inlays noted. These restorations all appear to be acceptable.  ENDODONTIC: The patient denies acute pulpitis symptoms. There are no obvious periapical radiolucencies. PROSTHODONTIC: The patient has no partial dentures.  OCCLUSION:  Patient has some supra-eruption drifting of tooth numbers 2 and 15. However, the occlusion is stable at this time.  RADIOGRAPHIC INTERPRETATION: An Orthopantogram was taken and supplemented with a full series of dental radiographs. There are multiple missing teeth. There are implants in the area of tooth numbers 5 and 12. There is incipient bone loss noted. There are multiple dental restorations noted. There is a bridge from tooth #7 through 9. There is supra-eruption drifting of the unopposed teeth into the edentulous areas.  Assessments: 1. Cancer of the right tonsil 2. Pre-chemoradiation therapy dental protocol 3. Chronic periodontitis with bone loss 4. Accretions 5. Selective areas of gingival recession 6. Mandibular anterior incisal attrition 7. Multiple missing teeth most replaced with implants and crown and bridge therapy 8. 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 I discussed the risks, benefits, and complications of various treatment options with the patient in relationship to his medical and dental conditions, anticipated chemoradiation therapy, and chemoradiation therapy side effects to include xerostomia, radiation caries, trismus, mucositis, taste changes, gum and jaw bone changes, and risks for infection and osteoradionecrosis. We discussed various treatment options to include no treatment, multiple extraction of teeth  and the primary field of radiation therapy,  alveoloplasty, pre-prosthetic surgery as indicated, periodontal therapy, dental restorations, root canal therapy, crown and bridge therapy, implant therapy, and replacement of missing teeth as indicated. The patient currently wishes to proceed with referral to an oral surgeon for a second opinion concerning extraction of teeth in the primary field of radiation therapy with alveoloplasty and  possible mandibular right torus reduction. The patient has been scheduled for this consult appointment with Dr. Loyal Gambler for tomorrow at 2:15 PM.  Patient will then follow-up with his primary dentist, Dr. Theda Belfast, for dental cleaning. Patient agreed to proceed with impressions for the fabrication of fluoride trays and scattered protection devices. Prescription for FluoriSHIELD was sent to [facility] outpatient pharmacy.  2. Discussion of findings with medical team and coordination of future medical and dental care as needed. Discussion of final ports and doses to be discussed with Dr. Loyal Gambler and Dr. Isidore Moos as indicated.  I spent in excess of 120 minutes during the conduct of this consultation and >50% of this time involved direct face-to-face encounter for counseling and/or coordination of the patient's care.    Lenn Cal, DDS

## 2014-05-07 NOTE — Telephone Encounter (Signed)
added NP appt w/NG for 10/28 @ 10am per staff message from desk nurse. per message desk nurse will contact pt. pt to arrive between 9:30am and 9:45am.

## 2014-05-07 NOTE — Patient Instructions (Signed)

## 2014-05-08 ENCOUNTER — Ambulatory Visit
Admission: RE | Admit: 2014-05-08 | Discharge: 2014-05-08 | Disposition: A | Discharge status: Home / Self Care | Payer: BC Managed Care – PPO | Source: Ambulatory Visit | Attending: Radiation Oncology | Admitting: Radiation Oncology

## 2014-05-08 ENCOUNTER — Ambulatory Visit: Payer: BC Managed Care – PPO

## 2014-05-08 ENCOUNTER — Encounter: Payer: Self-pay | Admitting: Radiation Oncology

## 2014-05-08 ENCOUNTER — Encounter: Payer: Self-pay | Admitting: Hematology and Oncology

## 2014-05-08 ENCOUNTER — Encounter: Payer: Self-pay | Admitting: *Deleted

## 2014-05-08 ENCOUNTER — Ambulatory Visit (HOSPITAL_BASED_OUTPATIENT_CLINIC_OR_DEPARTMENT_OTHER): Payer: BC Managed Care – PPO | Admitting: Hematology and Oncology

## 2014-05-08 VITALS — BP 150/76 | HR 57 | Temp 98.2°F | Resp 20 | Ht 74.0 in | Wt 195.7 lb

## 2014-05-08 VITALS — BP 125/74 | HR 70 | Temp 98.9°F | Resp 20 | Ht 74.0 in | Wt 195.6 lb

## 2014-05-08 DIAGNOSIS — Z510 Encounter for antineoplastic radiation therapy: Secondary | ICD-10-CM | POA: Diagnosis present

## 2014-05-08 DIAGNOSIS — C099 Malignant neoplasm of tonsil, unspecified: Secondary | ICD-10-CM

## 2014-05-08 DIAGNOSIS — F1210 Cannabis abuse, uncomplicated: Secondary | ICD-10-CM | POA: Diagnosis not present

## 2014-05-08 DIAGNOSIS — F1020 Alcohol dependence, uncomplicated: Secondary | ICD-10-CM

## 2014-05-08 NOTE — Assessment & Plan Note (Signed)
The patient would be an excellent candidate for concurrent chemoradiation therapy. He will need prophylactic placement of feeding tube and port prior to treatment. In the mean time, I am waiting for dental clearance before proceeding with those orders. He would need chemotherapy education class and blood work prior to this to treatment. I recommend the patient to identify care providers as he is next of kins, his brothers are away in another state. In the meantime, I will touch base with other team providers to coordinate his appointment.

## 2014-05-08 NOTE — Assessment & Plan Note (Signed)
I recommend he stop drinking as this likely the cause of his cancer.

## 2014-05-08 NOTE — Progress Notes (Signed)
[facility] CONSULT NOTE  Patient Care Team: Pcp Not In System as PCP - General Brooks Sailors, RN as Oncology Nurse Navigator Eppie Gibson, MD as Attending Physician (Radiation Oncology) Karie Mainland, RD as Dietitian (Nutrition)  CHIEF COMPLAINTS/PURPOSE OF CONSULTATION:  Tonsil cancer  HISTORY OF PRESENTING ILLNESS:  [name] 63 y.o. male is here because of newly diagnosed squamous cell carcinoma of the tonsil. I reviewed his records extensively. The patient has noted new lump in the right the neck since February of 2015. He was treated by his primary care Phill Steck with antibiotics which resolved the enlarged nodes temporarily but then it recurred. He said he started to feel enlarging lymph nodes in the right the neck again and that prompted multiple investigation.   Cancer of Right Tonsil   03/15/2014 Imaging Ct scan of neck showed bulky adenopathy in the right neck, likely metastatic squamous cell carcinoma.   03/20/2014 Procedure Accession: [id number] FNA of right neck lymph node is non-diagnostic   03/29/2014 Imaging PET/CT showed mild uptake bilateral tonsil and extensive right cervical chain lymphadenopathy with malignant range hypermetabolism   04/19/2014 Surgery Direct laryngoscopy was normal and tonsillectomy was performed   04/19/2014 Pathology Results Accession: [id number] right tonsil showed squamous cell carcinoma, P16 positive   The patient does not smoke but drinks excessively. He denies any hearing deficit. No baseline peripheral neuropathy. He is single and lives alone. He identified his brothers as his next of kin but they are in [place].  MEDICAL HISTORY:  Past Medical History  Diagnosis Date  . Depression     "years ago"    SURGICAL HISTORY: Past Surgical History  Procedure Laterality Date  . Hernia repair Left 2001    inguinal hernia  . Direct laryngoscopy N/A 04/19/2014    Procedure: DIRECT LARYNGOSCOPY AND BIOPSY;  Surgeon: Jerrell Belfast, MD;  Location: [facility];  Service: ENT;  Laterality: N/A;  . Tonsillectomy Right 04/19/2014    Procedure: RIGHT SIDE TONSILLECTOMY;  Surgeon: Jerrell Belfast, MD;  Location: [facility];  Service: ENT;  Laterality: Right;    SOCIAL HISTORY: History   Social History  . Marital Status: Single    Spouse Name: N/A    Number of Children: N/A  . Years of Education: N/A   Occupational History  . Not on file.   Social History Main Topics  . Smoking status: Never Smoker   . Smokeless tobacco: Never Used  . Alcohol Use: 16.8 oz/week    28 Cans of beer per week     Comment: 3-4 beers per night X 20 years  . Drug Use: Yes    Special: Marijuana  . Sexual Activity: Not on file   Other Topics Concern  . Not on file   Social History Narrative   The patient is single. The patient is a Geophysicist/field seismologist.   The patient moved from [place], [place] to [place] in March of 2013.   The patient is a [employer] and [employer].    FAMILY HISTORY: Family History  Problem Relation Age of Onset  . Dementia Mother   . Cancer Brother     colon    ALLERGIES:  has No Known Allergies.  MEDICATIONS:  Current Outpatient Prescriptions  Medication Sig Dispense Refill  . HYDROcodone-acetaminophen (NORCO/VICODIN) 5-325 MG per tablet Take 1 tablet by mouth every 6 (six) hours as needed for moderate pain.       No current facility-administered medications for this visit.  REVIEW OF SYSTEMS:   Constitutional: Denies fevers, chills or abnormal night sweats Eyes: Denies blurriness of vision, double vision or watery eyes Ears, nose, mouth, throat, and face: Denies mucositis or sore throat Respiratory: Denies cough, dyspnea or wheezes Cardiovascular: Denies palpitation, chest discomfort or lower extremity swelling Gastrointestinal:  Denies nausea, heartburn or change in bowel habits Skin: Denies abnormal skin rashes Lymphatics: Denies new lymphadenopathy or easy  bruising Neurological:Denies numbness, tingling or new weaknesses Behavioral/Psych: Mood is stable, no new changes  All other systems were reviewed with the patient and are negative.  PHYSICAL EXAMINATION: ECOG PERFORMANCE STATUS: 0 - Asymptomatic  Filed Vitals:   05/08/14 1054  BP: 150/76  Pulse: 57  Temp: 98.2 F (36.8 C)  Resp: 20   Filed Weights   05/08/14 1054  Weight: 195 lb 11.2 oz (88.769 kg)    GENERAL:alert, no distress and comfortable SKIN: skin color, texture, turgor are normal, no rashes or significant lesions EYES: normal, conjunctiva are pink and non-injected, sclera clear OROPHARYNX:no exudate, no erythema and lips, buccal mucosa, and tongue normal  NECK: supple, thyroid normal size, non-tender, without nodularity LYMPH:  Enlarged cervical lymphadenopathy. LUNGS: clear to auscultation and percussion with normal breathing effort HEART: regular rate & rhythm and no murmurs and no lower extremity edema ABDOMEN:abdomen soft, non-tender and normal bowel sounds Musculoskeletal:no cyanosis of digits and no clubbing  PSYCH: alert & oriented x 3 with fluent speech NEURO: no focal motor/sensory deficits  LABORATORY DATA:  I have reviewed the data as listed Lab Results  Component Value Date   WBC 5.7 04/12/2014   HGB 13.2 04/12/2014   HCT 37.7* 04/12/2014   MCV 94.5 04/12/2014   PLT 189 04/12/2014   No results found for this basename: NA, K, CL, CO2    RADIOGRAPHIC STUDIES: I have personally reviewed the radiological images as listed and agreed with the findings in the report.   ASSESSMENT & PLAN:  Cancer of Right Tonsil The patient would be an excellent candidate for concurrent chemoradiation therapy. He will need prophylactic placement of feeding tube and port prior to treatment. In the mean time, I am waiting for dental clearance before proceeding with those orders. He would need chemotherapy education class and blood work prior to this to treatment. I  recommend the patient to identify care providers as he is next of kins, his brothers are away in another state. In the meantime, I will touch base with other team providers to coordinate his appointment.  Alcoholism I recommend he stop drinking as this likely the cause of his cancer.    All questions were answered. The patient knows to call the clinic with any problems, questions or concerns. I spent 40 minutes counseling the patient face to face. The total time spent in the appointment was 60 minutes and more than 50% was on counseling.     [facility], [place], MD 05/08/2014 7:57 PM

## 2014-05-08 NOTE — Addendum Note (Signed)
Encounter addended by: Deirdre Evener, RN on: 05/08/2014  5:57 PM<BR>     Documentation filed: Charges VN

## 2014-05-08 NOTE — Progress Notes (Signed)
Please see the Nurse Progress Note in the MD Initial Consult Encounter for this patient. 

## 2014-05-09 ENCOUNTER — Telehealth: Payer: Self-pay | Admitting: *Deleted

## 2014-05-09 ENCOUNTER — Encounter (HOSPITAL_COMMUNITY): Payer: Self-pay | Admitting: Dentistry

## 2014-05-09 ENCOUNTER — Ambulatory Visit (HOSPITAL_COMMUNITY): Payer: Medicaid - Dental | Admitting: Dentistry

## 2014-05-09 VITALS — BP 143/77 | HR 61 | Temp 98.9°F

## 2014-05-09 DIAGNOSIS — Z463 Encounter for fitting and adjustment of dental prosthetic device: Secondary | ICD-10-CM

## 2014-05-09 DIAGNOSIS — C099 Malignant neoplasm of tonsil, unspecified: Secondary | ICD-10-CM

## 2014-05-09 DIAGNOSIS — Z0189 Encounter for other specified special examinations: Secondary | ICD-10-CM

## 2014-05-09 NOTE — Patient Instructions (Signed)
Patient to return to dental clinic once decision is made concerning the need for dental extractions by the oral surgeon.  Patient is to call dental medicine once this decision is determined. The patient will then be scheduled for a follow-up evaluation for insertion of fluoride trays. Patient was reminded to set up a dental cleaning appointment with his primary dentist. Dr. Enrique Sack

## 2014-05-09 NOTE — Progress Notes (Addendum)
Met with patient during his initial consult with Dr. Isidore Moos. 1. Further explained my role as his navigator, provided contact information for myself and Dr. Isidore Moos, encouraged him to contact me with questions/concerns as treatments/procedures begin. 2. Provided New Patient Information packet:  Contact information for physician and navigator  Advance Directive information ([place] blue pamphlet)  Fall Prevention Patient Safety Plan  [facility] campus map with highlight of [place] 3. Provided introductory explanation of radiation treatment including SIM planning and fitting of head mask, showed example of mask.   4. Provided photos/diagrams of feeding tube and PAC, explained their purpose. 5. Provided a tour of SIM and Tomo areas, explained treatment and arrival procedures.   He verbalized understanding of information provided.  He will be scheduled for next week's [place] for appts with dietician, SW, PT Lymphedema.  Gayleen Orem, RN, BSN, [place] at [place] [phone]

## 2014-05-09 NOTE — Telephone Encounter (Signed)
CALLED PATIENT TO INFORM OF LAB APPT. FOR 05-13-14 AND HIS APPT. WITH CARL [place] ON 05-27-14- ARRIVAL TIME - 1:45 PM, NO ANSWER WILL CALL LATER.

## 2014-05-09 NOTE — Progress Notes (Signed)
05/09/2014  Patient Name:   [name] Date of Birth:   1950-08-04 Medical Record Number: [id number]  BP 143/77  Pulse 61  Temp(Src) 98.9 F (37.2 C) (Oral)  Floyce Stakes now presents for insertion of upper and lower scatter protection devices.  PROCEDURE: Appliances were tried in and adjusted as needed. [place] Trismus device was previously  fabricated at 50 mm using 30 sticks. Postop instructions were provided and a written and verbal format concerning the use and care of appliances. All questions were answered. Patient to return to clinic for insertion of fluoride trays once decision is made concerning need for dental extractions by the oral surgeon.  The patient is to follow-up with stimulation with Dr. Isidore Moos on Monday. Patient to call if questions or problems arise before then.  Lenn Cal, DDS

## 2014-05-10 ENCOUNTER — Other Ambulatory Visit: Payer: Self-pay | Admitting: Radiation Oncology

## 2014-05-10 DIAGNOSIS — C099 Malignant neoplasm of tonsil, unspecified: Secondary | ICD-10-CM

## 2014-05-13 ENCOUNTER — Ambulatory Visit
Admission: RE | Admit: 2014-05-13 | Discharge: 2014-05-13 | Disposition: A | Discharge status: Home / Self Care | Payer: BC Managed Care – PPO | Source: Ambulatory Visit | Attending: Radiation Oncology | Admitting: Radiation Oncology

## 2014-05-13 ENCOUNTER — Encounter: Payer: Self-pay | Admitting: Radiation Oncology

## 2014-05-13 VITALS — BP 133/74 | HR 56 | Temp 98.3°F | Resp 12 | Wt 193.6 lb

## 2014-05-13 VITALS — BP 133/74 | HR 56 | Temp 98.3°F | Resp 12 | Ht 74.0 in | Wt 193.6 lb

## 2014-05-13 DIAGNOSIS — C099 Malignant neoplasm of tonsil, unspecified: Secondary | ICD-10-CM

## 2014-05-13 DIAGNOSIS — Z510 Encounter for antineoplastic radiation therapy: Secondary | ICD-10-CM | POA: Diagnosis not present

## 2014-05-13 LAB — BUN AND CREATININE (CC13)
BUN: 11.7 mg/dL (ref 7.0–26.0)
Creatinine: 1 mg/dL (ref 0.7–1.3)

## 2014-05-13 LAB — TSH CHCC: TSH: 0.756 m(IU)/L (ref 0.320–4.118)

## 2014-05-13 MED ORDER — SODIUM CHLORIDE 0.9 % IJ SOLN
10.0000 mL | Freq: Once | INTRAMUSCULAR | Status: AC
  Administered 2014-05-13: 10 mL via INTRAVENOUS

## 2014-05-13 NOTE — Progress Notes (Signed)
[name] here today for IV start prior to simulation.  His VSS.  He denies any hx. Of diabetes and denies any prior contrast reaction. His current BUN and Creat. from today is 11.7 and 1.0 respectively.  He appears nervous.  IV established in right inner forearm with a 22 gauge angiocath. Brisk blood return and flushed with 0.9 NS without any resistance.  Secured and 3 inch extension attached.  Escorted to [place].

## 2014-05-13 NOTE — Progress Notes (Signed)
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 An Aquaplast head and shoulder mask was custom fitted to the patient's anatomy. High-resolution CT axial imaging was obtained of the head and neck with contrast. I verified that the quality of the imaging is good for treatment planning. 1 Medically Necessary Treatment Device was fabricated and supervised by me: Aquaplast mask.   Treatment planning note I plan to treat the patient with helical Tomotherapy, IMRT. I plan to treat the patient's tumor and bilateral neck nodes. I plan to treat to a total dose of 70 Gray in 35  Fractions. Dose calculation was ordered from dosimetry.  IMRT planning Note  IMRT is an important modality to deliver adequate dose to the patient's at risk tissues while sparing the patient's normal structures, including the: esophagus, parotid tissue, mandible, brain stem, spinal cord, oral cavity, brachial plexus.  This justifies the use of IMRT in the patient's treatment.   Special Treatment Procedure Note:  The patient will be receiving chemotherapy concurrently. Chemotherapy heightens the risk of side effects. I have considered this during the patient's treatment planning process and will monitor the patient accordingly for side effects on a weekly basis. Concurrent chemotherapy increases the complexity of this patient's treatment and therefore this constitutes a special treatment procedure.  -----------------------------------  Eppie Gibson, MD

## 2014-05-14 ENCOUNTER — Telehealth: Payer: Self-pay | Admitting: *Deleted

## 2014-05-14 NOTE — Telephone Encounter (Signed)
Spoke with patient, reminded him of his attendance at [facility] tomorrow with an arrival time of 12:30.  He verbalized understanding.  Gayleen Orem, RN, BSN, [place] at [place] [phone]

## 2014-05-15 ENCOUNTER — Ambulatory Visit: Payer: BC Managed Care – PPO | Attending: Radiation Oncology | Admitting: Physical Therapy

## 2014-05-15 ENCOUNTER — Other Ambulatory Visit: Payer: Self-pay | Admitting: Hematology and Oncology

## 2014-05-15 ENCOUNTER — Encounter: Payer: Self-pay | Admitting: Physical Therapy

## 2014-05-15 ENCOUNTER — Encounter: Payer: Self-pay | Admitting: *Deleted

## 2014-05-15 ENCOUNTER — Ambulatory Visit: Payer: BC Managed Care – PPO | Admitting: Nutrition

## 2014-05-15 ENCOUNTER — Telehealth: Payer: Self-pay | Admitting: Hematology and Oncology

## 2014-05-15 DIAGNOSIS — R293 Abnormal posture: Secondary | ICD-10-CM | POA: Diagnosis not present

## 2014-05-15 DIAGNOSIS — Z510 Encounter for antineoplastic radiation therapy: Secondary | ICD-10-CM | POA: Diagnosis not present

## 2014-05-15 DIAGNOSIS — C099 Malignant neoplasm of tonsil, unspecified: Secondary | ICD-10-CM

## 2014-05-15 NOTE — Progress Notes (Signed)
To provide support and encouragement, care continuity and to assess for needs, met with patient during [facility]. 1. We discussed the merits of a feeding tube. 2. We discussed merits of dental extractions if deemed appropriate.  I explained that Dr. Isidore Moos will be contacting Dr. Loyal Gambler to discuss further. 3. He expressed his desire for a Product manager.  I indicated I would f/u on this for him. He understands he can contact me with questions/concerns.  Gayleen Orem, RN, BSN, [place] at [place] [phone]

## 2014-05-15 NOTE — Progress Notes (Signed)
Patient was seen during head and neck clinic.  63 year old male diagnosed with right tonsil cancer.  He is a patient of Dr. Alvy Bimler and Dr. Isidore Moos.  Past medical history includes depression.  Medications were reviewed.  Labs were reviewed.  Height: 6 feet 2 inches. Weight: 193.6 pounds on November 2. Usual body weight: 198 pounds. BMI: 24.5.  Patient has history of daily Alcohol intake.  Weight is decreased approximately 5 pounds from usual body weight.  Patient reports eating regular meals.  Nutrition diagnosis: Food and nutrition related knowledge deficit related to right tonsil cancer as evidenced by no prior need for nutrition related information.  Intervention: Patient educated to consume high-calorie, high-protein foods in 6 small meals and snacks daily. Reviewed strategies for eating if patient develops nausea and vomiting. Encouraged patient to increase hydration during treatment. Recommended patient adhere to bowel regimen to avoid constipation. Fact sheets were provided.  Questions were answered.  Teach back method used.  Contact information was given.  Monitoring, evaluation, goals: Patient will tolerate adequate calories and protein for minimal weight loss throughout treatment.  Next visit: To be schedule weekly once treatment plan determined.  **Disclaimer: This note was dictated with voice recognition software. Similar sounding words can inadvertently be transcribed and this note may contain transcription errors which may not have been corrected upon publication of note.**

## 2014-05-15 NOTE — Progress Notes (Signed)
See physical therapy note.

## 2014-05-15 NOTE — Therapy (Signed)
Physical Therapy Treatment  Patient Details  Name: [name] MRN: [id number] Date of Birth: 18-Jan-1951  Encounter Date: 05/15/2014      PT End of Session - 05/15/14 1627    Visit Number 1   Number of Visits 1   PT Start Time 7416   PT Stop Time 1400   PT Time Calculation (min) 15 min      Past Medical History  Diagnosis Date  . Depression     "years ago"    Past Surgical History  Procedure Laterality Date  . Hernia repair Left 2001    inguinal hernia  . Direct laryngoscopy N/A 04/19/2014    Procedure: DIRECT LARYNGOSCOPY AND BIOPSY;  Surgeon: Jerrell Belfast, MD;  Location: [place];  Service: ENT;  Laterality: N/A;  . Tonsillectomy Right 04/19/2014    Procedure: RIGHT SIDE TONSILLECTOMY;  Surgeon: Jerrell Belfast, MD;  Location: [place];  Service: ENT;  Laterality: Right;    There were no vitals taken for this visit.  Visit Diagnosis:  Abnormal posture - Plan: PT plan of care cert/re-cert      Subjective Assessment - 05/15/14 1600    Symptoms Pt. presented 6-8 months ago with mass on right neck that had varied in size.   Pertinent History PET scan 03/29/14 shoed extensive right cervical chain lymphadenopathy with malignant range hypermetabolism and low-level hypermetabolism in region of the pharyngeal tonsils bilaterally.  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   Currently in Pain? No/denies          [facility] PT Assessment - 05/15/14 1600    Assessment   Medical Diagnosis right tonsil invasive squamous cell carcinoma   Onset Date 03/29/14   Precautions   Precautions Other (comment)  cancer precautions   Balance Screen   Has the patient fallen in the past 6 months No   Has the patient had a decrease in activity level because of a fear of falling?  No   Is the patient reluctant to leave their home because of a fear of falling?  No   Home Environment   Family/patient expects to be discharged  to: Private residence   Living Arrangements Alone   Prior Function   Level of [place] with basic ADLs   Observation/Other Assessments   Skin Integrity intact   Posture/Postural Control   Posture/Postural Control Postural limitations   Postural Limitations --  forward head   AROM   Cervical Flexion WFL   Cervical Extension WFL   Cervical - Right Side Bend 25% loss  limited by neck lump   Cervical - Left Side Bend WFL   Cervical - Right Rotation 25% loss  limited by neck lump   Cervical - Left Rotation [facility]   Special Tests    Special Tests --  Sit to stand = 16 reps in 30 seconds   Ambulation/Gait   Ambulation/Gait Yes   Ambulation/Gait Assistance 7: Independent            Education - 05/15/14 1625    Education provided Yes   Education Details neck ROM, posture, walking, lymphedema info   Education Details Patient   Methods Explanation;Demonstration;Handout;Verbal cues   Comprehension Verbalized understanding;Returned demonstration              Plan - 05/15/14 1627    Clinical Impression Statement Pt. may benefit from therapy going forward if lymphedema develops, if neck tightness increases, if posture worsens, or  if fatigue is an issue.   Rehab Potential Good   PT Frequency Other (Comment)  Evaluation only at this time   PT Duration Other (comment)  Evaluation only at this time   PT Treatment/Interventions Patient/family education   PT Plan Pt. will be seen as needed for follow-up if problems develop as he goes through radiation treatment or after treatment is completed.        Problem List Patient Active Problem List   Diagnosis Date Noted  . Alcoholism 05/08/2014  . Cancer of Right Tonsil 05/07/2014  . Mass of right side of neck 04/19/2014            LYMPHEDEMA/ONCOLOGY QUESTIONNAIRE - 05/15/14 1600    Lymphedema Assessments Head and Neck   4 cm superior to sternal notch around neck 40.7 cm.  40.7   6 cm superior to  sternal notch around neck 39.7 cm.   8 cm superior to sternal notch around neck 41 cm.                                        Head and Neck Clinic Goals - 05/15/14 1632    Patient will be able to verbalize understanding of a home exercise program for cervical range of motion, posture, and walking.    Time 1   Period Days   Status Achieved   Patient will be able to verbalize understanding of proper sitting and standing posture.    Time 1   Period Days   Status Achieved   Patient will be able to verbalize understanding of lymphedema risk and availability of treatment for this condition.    Time 1   Period Days   Status Achieved        Skluzacek,DONNA 05/15/2014, 4:36 PM

## 2014-05-15 NOTE — Telephone Encounter (Addendum)
S/w pt confirming chemo edu class for 11/09 per 11/04 POF labs/ov, sent msg to add chemo and pt will come by to pick up schedule at next visit. Lft msg w/IR to call pt to sch Peg/Port ASAP..... KJ

## 2014-05-15 NOTE — Progress Notes (Signed)
Head & Neck Multidisciplinary Clinic Clinical Social Work  Clinical Social Work met with patient/family at head & neck multidisciplinary clinic to offer support and assess for psychosocial needs. Mr. Kilker shared he lives alone and his children live out of state.  He shared his friends are his primary support system locally.  He previously indicated his distress as a 5 at his radiation oncology visit on 05/08/14.  The patient reports his distress has lowered since that visit- he reports he is focusing on "living in the moment" and being mindful.  He feels these techniques have helped him to cope with his current situation.  Mr. Hardenbrook was interested in [facility] support programs, specifically yoga and other art programs offered through [employer].  The patient is interested in completing his healthcare advance directives.  CSW provided patient with a packet and encouraged him to follow up with CSW to complete.  ONCBCN DISTRESS SCREENING 05/08/2014  Screening Type Initial Screening  Distress experienced in past week (1-10) 5  Emotional problem type Adjusting to illness;Isolation/feeling alone  Information Concerns Type Lack of info about diagnosis    Clinical Social Work briefly discussed Clinical Social Work role and [employer] support programs/services.  Clinical Social Work encouraged patient to call with any additional questions or concerns.   Polo Riley, MSW, LCSW, OSW-C Clinical Social Worker [facility] [phone]

## 2014-05-15 NOTE — Addendum Note (Signed)
Encounter addended by: Deirdre Evener, RN on: 05/15/2014 10:43 AM<BR>     Documentation filed: Notes Section

## 2014-05-16 ENCOUNTER — Telehealth: Payer: Self-pay | Admitting: *Deleted

## 2014-05-16 ENCOUNTER — Telehealth: Payer: Self-pay | Admitting: Hematology and Oncology

## 2014-05-16 NOTE — Telephone Encounter (Signed)
s.w. pt and advised on NOV appt time change...pt ok adn aware

## 2014-05-16 NOTE — Telephone Encounter (Signed)
Per staff message and POF I have scheduled appts. Advised scheduler of appts. JMW  

## 2014-05-17 ENCOUNTER — Other Ambulatory Visit: Payer: Self-pay | Admitting: Radiology

## 2014-05-17 ENCOUNTER — Encounter (HOSPITAL_COMMUNITY): Payer: Self-pay | Admitting: Dentistry

## 2014-05-17 ENCOUNTER — Encounter (HOSPITAL_COMMUNITY): Payer: Self-pay | Admitting: Pharmacy Technician

## 2014-05-17 ENCOUNTER — Telehealth: Payer: Self-pay | Admitting: *Deleted

## 2014-05-17 ENCOUNTER — Ambulatory Visit (HOSPITAL_COMMUNITY): Payer: Medicaid - Dental | Admitting: Dentistry

## 2014-05-17 VITALS — BP 141/57 | HR 68 | Temp 98.2°F

## 2014-05-17 DIAGNOSIS — C099 Malignant neoplasm of tonsil, unspecified: Secondary | ICD-10-CM

## 2014-05-17 DIAGNOSIS — Z463 Encounter for fitting and adjustment of dental prosthetic device: Secondary | ICD-10-CM

## 2014-05-17 DIAGNOSIS — Z0189 Encounter for other specified special examinations: Secondary | ICD-10-CM

## 2014-05-17 NOTE — Telephone Encounter (Signed)
Patient called for clarification of Chemo Education appt for Monday.  I reviewed with him other appts for the week, explained what to expect on Tuesday when PAC/PEG placement scheduled, as well as on Wednesday for first chemo infusion.    Gayleen Orem, RN, BSN, [place] at [place] [phone]

## 2014-05-17 NOTE — Telephone Encounter (Signed)
Duplicate entry

## 2014-05-17 NOTE — Telephone Encounter (Signed)
Entry error

## 2014-05-17 NOTE — Progress Notes (Signed)
05/17/2014  Patient Name:   [name] Date of Birth:   29-Aug-1950 Medical Record Number: [id number]  BP 141/57 mmHg  Pulse 68  Temp(Src) 98.2 F (36.8 C) (Oral)  Floyce Stakes now presents for insertion of upper and lower fluoride trays.  PROCEDURE: Appliances were tried in and adjusted as needed. [place] Trismus device was previously fabricated. Postop instructions were provided and a written and verbal format concerning the use and care of appliances. All questions were answered. Patient to return to clinic for periodic oral examination in approximately 2-3 weeks during radiation therapy. Patient is reminded to follow-up with his primary dentist for dental cleaning prior to start of chemoradiation therapy. Patient to call if questions or problems arise before then.  Lenn Cal, DDS

## 2014-05-17 NOTE — Patient Instructions (Signed)

## 2014-05-20 ENCOUNTER — Other Ambulatory Visit: Payer: Self-pay | Admitting: *Deleted

## 2014-05-20 ENCOUNTER — Other Ambulatory Visit: Payer: BC Managed Care – PPO

## 2014-05-20 ENCOUNTER — Other Ambulatory Visit: Payer: Self-pay | Admitting: Hematology and Oncology

## 2014-05-20 DIAGNOSIS — C099 Malignant neoplasm of tonsil, unspecified: Secondary | ICD-10-CM

## 2014-05-20 NOTE — Progress Notes (Signed)
Order placed for [facility] for teaching new PEG tube.  Lurlean Leyden, RN coordinator for [facility] notified of new order.

## 2014-05-21 ENCOUNTER — Other Ambulatory Visit (HOSPITAL_BASED_OUTPATIENT_CLINIC_OR_DEPARTMENT_OTHER): Payer: BC Managed Care – PPO

## 2014-05-21 ENCOUNTER — Ambulatory Visit (HOSPITAL_COMMUNITY)
Admission: RE | Admit: 2014-05-21 | Discharge: 2014-05-21 | Disposition: A | Discharge status: Home / Self Care | Payer: BC Managed Care – PPO | Source: Ambulatory Visit | Attending: Hematology and Oncology | Admitting: Hematology and Oncology

## 2014-05-21 ENCOUNTER — Other Ambulatory Visit (HOSPITAL_COMMUNITY): Payer: Self-pay | Admitting: Dentistry

## 2014-05-21 ENCOUNTER — Telehealth: Payer: Self-pay | Admitting: Hematology and Oncology

## 2014-05-21 ENCOUNTER — Encounter (HOSPITAL_COMMUNITY): Payer: Self-pay

## 2014-05-21 ENCOUNTER — Ambulatory Visit: Payer: Self-pay | Admitting: Hematology and Oncology

## 2014-05-21 ENCOUNTER — Other Ambulatory Visit: Payer: Self-pay

## 2014-05-21 ENCOUNTER — Other Ambulatory Visit: Payer: Self-pay | Admitting: Hematology and Oncology

## 2014-05-21 ENCOUNTER — Encounter: Payer: Self-pay | Admitting: *Deleted

## 2014-05-21 ENCOUNTER — Ambulatory Visit (HOSPITAL_BASED_OUTPATIENT_CLINIC_OR_DEPARTMENT_OTHER): Payer: BC Managed Care – PPO | Admitting: Hematology and Oncology

## 2014-05-21 DIAGNOSIS — C099 Malignant neoplasm of tonsil, unspecified: Secondary | ICD-10-CM | POA: Diagnosis not present

## 2014-05-21 DIAGNOSIS — B977 Papillomavirus as the cause of diseases classified elsewhere: Secondary | ICD-10-CM

## 2014-05-21 DIAGNOSIS — F1290 Cannabis use, unspecified, uncomplicated: Secondary | ICD-10-CM | POA: Diagnosis not present

## 2014-05-21 LAB — COMPREHENSIVE METABOLIC PANEL (CC13)
ALK PHOS: 66 U/L (ref 40–150)
ALT: 16 U/L (ref 0–55)
AST: 22 U/L (ref 5–34)
Albumin: 3.9 g/dL (ref 3.5–5.0)
Anion Gap: 5 mEq/L (ref 3–11)
BILIRUBIN TOTAL: 0.54 mg/dL (ref 0.20–1.20)
BUN: 9.4 mg/dL (ref 7.0–26.0)
CO2: 30 mEq/L — ABNORMAL HIGH (ref 22–29)
CREATININE: 0.8 mg/dL (ref 0.7–1.3)
Calcium: 9.5 mg/dL (ref 8.4–10.4)
Chloride: 104 mEq/L (ref 98–109)
Glucose: 100 mg/dl (ref 70–140)
Potassium: 4.1 mEq/L (ref 3.5–5.1)
Sodium: 139 mEq/L (ref 136–145)
Total Protein: 7.1 g/dL (ref 6.4–8.3)

## 2014-05-21 LAB — PROTIME-INR
INR: 1.01 (ref 0.00–1.49)
Prothrombin Time: 13.4 seconds (ref 11.6–15.2)

## 2014-05-21 LAB — CBC WITH DIFFERENTIAL/PLATELET
BASO%: 0.7 % (ref 0.0–2.0)
BASOS ABS: 0 10*3/uL (ref 0.0–0.1)
EOS%: 0.8 % (ref 0.0–7.0)
Eosinophils Absolute: 0 10*3/uL (ref 0.0–0.5)
HEMATOCRIT: 39.5 % (ref 38.4–49.9)
HEMOGLOBIN: 13.3 g/dL (ref 13.0–17.1)
LYMPH%: 25.3 % (ref 14.0–49.0)
MCH: 32.7 pg (ref 27.2–33.4)
MCHC: 33.5 g/dL (ref 32.0–36.0)
MCV: 97.7 fL (ref 79.3–98.0)
MONO#: 0.5 10*3/uL (ref 0.1–0.9)
MONO%: 10.1 % (ref 0.0–14.0)
NEUT#: 2.9 10*3/uL (ref 1.5–6.5)
NEUT%: 63.1 % (ref 39.0–75.0)
Platelets: 191 10*3/uL (ref 140–400)
RBC: 4.05 10*6/uL — ABNORMAL LOW (ref 4.20–5.82)
RDW: 12.5 % (ref 11.0–14.6)
WBC: 4.6 10*3/uL (ref 4.0–10.3)
lymph#: 1.2 10*3/uL (ref 0.9–3.3)

## 2014-05-21 LAB — APTT: aPTT: 24 seconds (ref 24–37)

## 2014-05-21 LAB — MAGNESIUM (CC13): Magnesium: 2.5 mg/dl (ref 1.5–2.5)

## 2014-05-21 MED ORDER — CEFAZOLIN SODIUM-DEXTROSE 2-3 GM-% IV SOLR
2.0000 g | INTRAVENOUS | Status: AC
  Administered 2014-05-21: 2 g via INTRAVENOUS

## 2014-05-21 MED ORDER — MIDAZOLAM HCL 2 MG/2ML IJ SOLN
INTRAMUSCULAR | Status: AC
  Filled 2014-05-21: qty 6

## 2014-05-21 MED ORDER — OXYCODONE HCL 5 MG PO TABS: 5.0000 mg | ORAL_TABLET | ORAL | Status: DC | PRN

## 2014-05-21 MED ORDER — CEFAZOLIN SODIUM-DEXTROSE 2-3 GM-% IV SOLR
INTRAVENOUS | Status: AC
  Filled 2014-05-21: qty 50

## 2014-05-21 MED ORDER — HEPARIN SOD (PORK) LOCK FLUSH 100 UNIT/ML IV SOLN
INTRAVENOUS | Status: AC
  Filled 2014-05-21: qty 5

## 2014-05-21 MED ORDER — ONDANSETRON HCL 8 MG PO TABS: 8.0000 mg | ORAL_TABLET | Freq: Three times a day (TID) | ORAL | Status: DC | PRN

## 2014-05-21 MED ORDER — IOHEXOL 300 MG/ML  SOLN
10.0000 mL | Freq: Once | INTRAMUSCULAR | Status: AC | PRN
  Administered 2014-05-21: 15 mL

## 2014-05-21 MED ORDER — LIDOCAINE-PRILOCAINE 2.5-2.5 % EX CREA: TOPICAL_CREAM | CUTANEOUS | Status: DC

## 2014-05-21 MED ORDER — HEPARIN SOD (PORK) LOCK FLUSH 100 UNIT/ML IV SOLN
INTRAVENOUS | Status: AC | PRN
  Administered 2014-05-21: 500 [IU]

## 2014-05-21 MED ORDER — LIDOCAINE HCL 1 % IJ SOLN
INTRAMUSCULAR | Status: AC
  Filled 2014-05-21: qty 40

## 2014-05-21 MED ORDER — PROCHLORPERAZINE MALEATE 10 MG PO TABS: 10.0000 mg | ORAL_TABLET | Freq: Four times a day (QID) | ORAL | Status: DC | PRN

## 2014-05-21 MED ORDER — FENTANYL CITRATE 0.05 MG/ML IJ SOLN
INTRAMUSCULAR | Status: AC | PRN
  Administered 2014-05-21: 25 ug via INTRAVENOUS
  Administered 2014-05-21: 50 ug via INTRAVENOUS
  Administered 2014-05-21: 25 ug via INTRAVENOUS
  Administered 2014-05-21 (×2): 50 ug via INTRAVENOUS

## 2014-05-21 MED ORDER — MIDAZOLAM HCL 2 MG/2ML IJ SOLN
INTRAMUSCULAR | Status: AC | PRN
Start: 2014-05-21 — End: 2014-05-21
  Administered 2014-05-21 (×2): 1 mg via INTRAVENOUS
  Administered 2014-05-21: 0.5 mg via INTRAVENOUS
  Administered 2014-05-21 (×3): 1 mg via INTRAVENOUS
  Administered 2014-05-21: 0.5 mg via INTRAVENOUS
  Administered 2014-05-21: 1 mg via INTRAVENOUS

## 2014-05-21 MED ORDER — FENTANYL CITRATE 0.05 MG/ML IJ SOLN
INTRAMUSCULAR | Status: AC
  Filled 2014-05-21: qty 6

## 2014-05-21 MED ORDER — SODIUM FLUORIDE 1.1 % DT GEL: DENTAL | Status: DC

## 2014-05-21 MED ORDER — SODIUM CHLORIDE 0.9 % IV SOLN
INTRAVENOUS | Status: DC
  Administered 2014-05-21: 13:00:00 via INTRAVENOUS

## 2014-05-21 MED ORDER — MIDAZOLAM HCL 2 MG/2ML IJ SOLN
INTRAMUSCULAR | Status: AC
  Filled 2014-05-21: qty 4

## 2014-05-21 NOTE — Discharge Instructions (Signed)
Gastrostomy Tube Home Guide A gastrostomy tube is a tube that is surgically placed into the stomach. It is also called a "G-tube." G-tubes are used when a person is unable to eat and drink enough on their own to stay healthy. The tube is inserted into the stomach through a small cut (incision) in the skin. This tube is used for:  Feeding.  Giving medication. GASTROSTOMY TUBE CARE  Wash your hands with soap and water.  Remove the old dressing (if any). Some styles of G-tubes may need a dressing inserted between the skin and the G-tube. Other types of G-tubes do not require a dressing. Ask your health care provider if a dressing is needed.  Check the area where the tube enters the skin (insertion site) for redness, swelling, or pus-like (purulent) drainage. A small amount of clear or tan liquid drainage is normal. Check to make sure scar tissue (skin) is not growing around the insertion site. This could have a raised, bumpy appearance.  A cotton swab can be used to clean the skin around the tube:  When the G-tube is first put in, a normal saline solution or water can be used to clean the skin.  Mild soap and warm water can be used when the skin around the G-tube site has healed.  Roll the cotton swab around the G-tube insertion site to remove any drainage or crusting at the insertion site. STOMACH RESIDUALS Feeding tube residuals are the amount of liquids that are in the stomach at any given time. Residuals may be checked before giving feedings, medications, or as instructed by your health care provider.  Ask your health care provider if there are instances when you would not start tube feedings depending on the amount or type of contents withdrawn from the stomach.  Check residuals by attaching a syringe to the G-tube and pulling back on the syringe plunger. Note the amount, and return the residual back into the stomach. FLUSHING THE G-TUBE  The G-tube should be periodically flushed with  clean warm water to keep it from clogging.  Flush the G-tube after feedings or medications. Draw up 30 mL of warm water in a syringe. Connect the syringe to the G-tube and slowly push the water into the tube.  Do not push feedings, medications, or flushes rapidly. Flush the G-tube gently and slowly.  Only use syringes made for G-tubes to flush medications or feedings.  Your health care provider may want the G-tube flushed more often or with more water. If this is the case, follow your health care provider's instructions. FEEDINGS Your health care provider will determine whether feedings are given as a bolus (a certain amount given at one time and at scheduled times) or whether feedings will be given continuously on a feeding pump.   Formulas should be given at room temperature.  If feedings are continuous, no more than 4 hours worth of feedings should be placed in the feeding bag. This helps prevent spoilage or accidental excess infusion.  Cover and place unused formula in the refrigerator.  If feedings are continuous, stop the feedings when medications or flushes are given. Be sure to restart the feedings.  Feeding bags and syringes should be replaced as instructed by your health care provider. GIVING MEDICATION   In general, it is best if all medications are in a liquid form for G-tube administration. Liquid medications are less likely to clog the G-tube.  Mix the liquid medication with 30 mL (or amount recommended by your  health care provider) of warm water.  Draw up the medication into the syringe.  Attach the syringe to the G-tube and slowly push the mixture into the G-tube.  After giving the medication, draw up 30 mL of warm water in the syringe and slowly flush the G-tube.  For pills or capsules, check with your health care provider first before crushing medications. Some pills are not effective if they are crushed. Some capsules are sustained-release medications.  If  appropriate, crush the pill or capsule and mix with 30 mL of warm water. Using the syringe, slowly push the medication through the tube, then flush the tube with another 30 mL of tap water. G-TUBE PROBLEMS G-tube was pulled out.  Cause: May have been pulled out accidentally.  Solutions: Cover the opening with clean dressing and tape. Call your health care provider right away. The G-tube should be put in as soon as possible (within 4 hours) so the G-tube opening (tract) does not close. The G-tube needs to be put in at a health care setting. An X-ray needs to be done to confirm placement before the G-tube can be used again. Redness, irritation, soreness, or foul odor around the gastrostomy site.  Cause: May be caused by leakage or infection.  Solutions: Call your health care provider right away. Large amount of leakage of fluid or mucus-like liquid present (a large amount means it soaks clothing).  Cause: Many reasons could cause the G-tube to leak.  Solutions: Call your health care provider to discuss the amount of leakage. Skin or scar tissue appears to be growing where tube enters skin.   Cause: Tissue growth may develop around the insertion site if the G-tube is moved or pulled on excessively.  Solutions: Secure tube with tape so that excess movement does not occur. Call your health care provider. G-tube is clogged.  Cause: Thick formula or medication.  Solutions: Try to slowly push warm water into the tube with a large syringe. Never try to push any object into the tube to unclog it. Do not force fluid into the G-tube. If you are unable to unclog the tube, call your health care provider right away. TIPS  Head of bed (HOB) position refers to the upright position of a person's upper body.  When giving medications or a feeding bolus, keep the [facility] up as told by your health care provider. Do this during the feeding and for 1 hour after the feeding or medication administration.  If  continuous feedings are being given, it is best to keep the [facility] up as told by your health care provider. When ADLs (activities of daily living) are performed and the [facility] needs to be flat, be sure to turn the feeding pump off. Restart the feeding pump when the [facility] is returned to the recommended height.  Do not pull or put tension on the tube.  To prevent fluid backflow, kink the G-tube before removing the cap or disconnecting a syringe.  Check the G-tube length every day. Measure from the insertion site to the end of the G-tube. If the length is longer than previous measurements, the tube may be coming out. Call your health care provider if you notice increasing G-tube length.  Oral care, such as brushing teeth, must be continued.  You may need to remove excess air (vent) from the G-tube. Your health care provider will tell you if this is needed.  Always call your health care provider if you have questions or problems with the G-tube.  SEEK IMMEDIATE MEDICAL CARE IF:  °· You have severe abdominal pain, tenderness, or abdominal bloating (distension). °· You have nausea or vomiting. °· You are constipated or have problems moving your bowels. °· The G-tube insertion site is red, swollen, has a foul smell, or has yellow or brown drainage. °· You have difficulty breathing or shortness of breath. °· You have a fever. °· You have a large amount of feeding tube residuals. °· The G-tube is clogged and cannot be flushed. °MAKE SURE YOU:  °· Understand these instructions. °· Will watch your condition. °· Will get help right away if you are not doing well or get worse. °Document Released: 09/06/2001 Document Revised: 11/12/2013 Document Reviewed: 03/05/2013 °ExitCare® Patient Information ©2015 [employer]. This information is not intended to replace advice given to you by your health care provider. Make sure you discuss any questions you have with your health care provider. °Implanted Port Insertion, Care  After °Refer to this sheet in the next few weeks. These instructions provide you with information on caring for yourself after your procedure. Your health care provider may also give you more specific instructions. Your treatment has been planned according to current medical practices, but problems sometimes occur. Call your health care provider if you have any problems or questions after your procedure. °WHAT TO EXPECT AFTER THE PROCEDURE °After your procedure, it is typical to have the following:  °· Discomfort at the port insertion site. Ice packs to the area will help. °· Bruising on the skin over the port. This will subside in 3-4 days. °HOME CARE INSTRUCTIONS °· After your port is placed, you will get a manufacturer's information card. The card has information about your port. Keep this card with you at all times.   °· Know what kind of port you have. There are many types of ports available.   °· Wear a medical alert bracelet in case of an emergency. This can help alert health care workers that you have a port.   °· The port can stay in for as long as your health care provider believes it is necessary.   °· A home health care nurse may give medicines and take care of the port.   °· You or a family member can get special training and directions for giving medicine and taking care of the port at home.   °SEEK MEDICAL CARE IF:  °· Your port does not flush or you are unable to get a blood return.   °· You have a fever or chills. °SEEK IMMEDIATE MEDICAL CARE IF: °· You have new fluid or pus coming from your incision.   °· You notice a bad smell coming from your incision site.   °· You have swelling, pain, or more redness at the incision or port site.   °· You have chest pain or shortness of breath. °Document Released: 04/18/2013 Document Revised: 07/03/2013 Document Reviewed: 04/18/2013 °ExitCare® Patient Information ©2015 [employer]. This information is not intended to replace advice given to you by your health  care provider. Make sure you discuss any questions you have with your health care provider. °Implanted Port Home Guide °An implanted port is a type of central line that is placed under the skin. Central lines are used to provide IV access when treatment or nutrition needs to be given through a person's veins. Implanted ports are used for long-term IV access. An implanted port may be placed because:  °· You need IV medicine that would be irritating to the small veins in your hands or arms.   °·   You need long-term IV medicines, such as antibiotics.   °· You need IV nutrition for a long period.   °· You need frequent blood draws for lab tests.   °· You need dialysis.   °Implanted ports are usually placed in the chest area, but they can also be placed in the upper arm, the abdomen, or the leg. An implanted port has two main parts:  °· Reservoir. The reservoir is round and will appear as a small, raised area under your skin. The reservoir is the part where a needle is inserted to give medicines or draw blood.   °· Catheter. The catheter is a thin, flexible tube that extends from the reservoir. The catheter is placed into a large vein. Medicine that is inserted into the reservoir goes into the catheter and then into the vein.   °HOW WILL I CARE FOR MY INCISION SITE? °Do not get the incision site wet. Bathe or shower as directed by your health care provider.  °HOW IS MY PORT ACCESSED? °Special steps must be taken to access the port:  °· Before the port is accessed, a numbing cream can be placed on the skin. This helps numb the skin over the port site.   °· Your health care provider uses a sterile technique to access the port. °· Your health care provider must put on a mask and sterile gloves. °· The skin over your port is cleaned carefully with an antiseptic and allowed to dry. °· The port is gently pinched between sterile gloves, and a needle is inserted into the port. °· Only "non-coring" port needles should be used to  access the port. Once the port is accessed, a blood return should be checked. This helps ensure that the port is in the vein and is not clogged.   °· If your port needs to remain accessed for a constant infusion, a clear (transparent) bandage will be placed over the needle site. The bandage and needle will need to be changed every week, or as directed by your health care provider.   °· Keep the bandage covering the needle clean and dry. Do not get it wet. Follow your health care provider's instructions on how to take a shower or bath while the port is accessed.   °· If your port does not need to stay accessed, no bandage is needed over the port.   °WHAT IS FLUSHING? °Flushing helps keep the port from getting clogged. Follow your health care provider's instructions on how and when to flush the port. Ports are usually flushed with saline solution or a medicine called heparin. The need for flushing will depend on how the port is used.  °· If the port is used for intermittent medicines or blood draws, the port will need to be flushed:   °¨ After medicines have been given.   °¨ After blood has been drawn.   °¨ As part of routine maintenance.   °· If a constant infusion is running, the port may not need to be flushed.   °HOW LONG WILL MY PORT STAY IMPLANTED? °The port can stay in for as long as your health care provider thinks it is needed. When it is time for the port to come out, surgery will be done to remove it. The procedure is similar to the one performed when the port was put in.  °WHEN SHOULD I SEEK IMMEDIATE MEDICAL CARE? °When you have an implanted port, you should seek immediate medical care if:  °· You notice a bad smell coming from the incision site.   °· You have swelling, redness,   or drainage at the incision site.   °· You have more swelling or pain at the port site or the surrounding area.   °· You have a fever that is not controlled with medicine. °Document Released: 06/28/2005 Document Revised:  04/18/2013 Document Reviewed: 03/05/2013 °ExitCare® Patient Information ©2015 [employer]. This information is not intended to replace advice given to you by your health care provider. Make sure you discuss any questions you have with your health care provider. °Conscious Sedation °Sedation is the use of medicines to promote relaxation and relieve discomfort and anxiety. Conscious sedation is a type of sedation. Under conscious sedation you are less alert than normal but are still able to respond to instructions or stimulation. Conscious sedation is used during short medical and dental procedures. It is milder than deep sedation or general anesthesia and allows you to return to your regular activities sooner.  °LET YOUR HEALTH CARE PROVIDER KNOW ABOUT:  °· Any allergies you have. °· All medicines you are taking, including vitamins, herbs, eye drops, creams, and over-the-counter medicines. °· Use of steroids (by mouth or creams). °· Previous problems you or members of your family have had with the use of anesthetics. °· Any blood disorders you have. °· Previous surgeries you have had. °· Medical conditions you have. °· Possibility of pregnancy, if this applies. °· Use of cigarettes, alcohol, or illegal drugs. °RISKS AND COMPLICATIONS °Generally, this is a safe procedure. However, as with any procedure, problems can occur. Possible problems include: °· Oversedation. °· Trouble breathing on your own. You may need to have a breathing tube until you are awake and breathing on your own. °· Allergic reaction to any of the medicines used for the procedure. °BEFORE THE PROCEDURE °· You may have blood tests done. These tests can help show how well your kidneys and liver are working. They can also show how well your blood clots. °· A physical exam will be done.   °· Only take medicines as directed by your health care provider. You may need to stop taking medicines (such as blood thinners, aspirin, or nonsteroidal  anti-inflammatory drugs) before the procedure.   °· Do not eat or drink at least 6 hours before the procedure or as directed by your health care provider. °· Arrange for a responsible adult, family member, or friend to take you home after the procedure. He or she should stay with you for at least 24 hours after the procedure, until the medicine has worn off. °PROCEDURE  °· An intravenous (IV) catheter will be inserted into one of your veins. Medicine will be able to flow directly into your body through this catheter. You may be given medicine through this tube to help prevent pain and help you relax. °· The medical or dental procedure will be done. °AFTER THE PROCEDURE °· You will stay in a recovery area until the medicine has worn off. Your blood pressure and pulse will be checked.   °·  Depending on the procedure you had, you may be allowed to go home when you can tolerate liquids and your pain is under control. °Document Released: 03/23/2001 Document Revised: 07/03/2013 Document Reviewed: 03/05/2013 °ExitCare® Patient Information ©2015 [employer]. This information is not intended to replace advice given to you by your health care provider. Make sure you discuss any questions you have with your health care provider. ° °

## 2014-05-21 NOTE — Telephone Encounter (Signed)
Pt confirmed MD visit per 11/10 POF, gave pt AVS..... KJ

## 2014-05-21 NOTE — Progress Notes (Signed)
Patient back from IR at 1740. Portacath and clamped G tube site CDI. [name] came to pick patient up and d/c instructions reviewed with patient and friend. Patient will stay with friend tonight. He is to be back in am for first chemo treatment. Patient/friend aware of when to call MD and number to call if any problems or concerns. Home at 1900.

## 2014-05-21 NOTE — H&P (Signed)
Chief Complaint: "I'm getting a port and stomach tube"  Referring Physician(s): Gorsuch,Ni  History of Present Illness: [name] is a 63 y.o. 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  Past Medical History  Diagnosis Date  . Depression     "years ago"    Past Surgical History  Procedure Laterality Date  . Hernia repair Left 2001    inguinal hernia  . Direct laryngoscopy N/A 04/19/2014    Procedure: DIRECT LARYNGOSCOPY AND BIOPSY;  Surgeon: Jerrell Belfast, MD;  Location: [place];  Service: ENT;  Laterality: N/A;  . Tonsillectomy Right 04/19/2014    Procedure: RIGHT SIDE TONSILLECTOMY;  Surgeon: Jerrell Belfast, MD;  Location: [place];  Service: ENT;  Laterality: Right;    Allergies: Review of patient's allergies indicates no known allergies.  Medications: Prior to Admission medications   Medication Sig Start Date End Date Taking? Authorizing Provider  HYDROcodone-acetaminophen (NORCO/VICODIN) 5-325 MG per tablet Take 1 tablet by mouth every 6 (six) hours as needed for moderate pain.    Historical Provider, MD  lidocaine-prilocaine (EMLA) cream Apply to affected area once 05/21/14   Heath Lark, MD  ondansetron (ZOFRAN) 8 MG tablet Take 1 tablet (8 mg total) by mouth every 8 (eight) hours as needed. 05/21/14   Heath Lark, MD  oxyCODONE (OXY IR/ROXICODONE) 5 MG immediate release tablet Take 1 tablet (5 mg total) by mouth every 4 (four) hours as needed for severe pain. 05/21/14   Heath Lark, MD  prochlorperazine (COMPAZINE) 10 MG tablet Take 1 tablet (10 mg total) by mouth every 6 (six) hours as needed (Nausea or vomiting). 05/21/14   Heath Lark, MD  sodium fluoride (FLUORISHIELD) 1.1 % GEL dental gel Instill one drop of gel per tooth space of fluoride tray. Place over teeth for 5 minutes. Remove. Spit out excess. Repeat nightly.  05/21/14   Lenn Cal, DDS    Family History  Problem Relation Age of Onset  . Dementia Mother   . Cancer Brother     colon    History   Social History  . Marital Status: Single    Spouse Name: N/A    Number of Children: N/A  . Years of Education: N/A   Social History Main Topics  . Smoking status: Never Smoker   . Smokeless tobacco: Never Used  . Alcohol Use: 16.8 oz/week    28 Cans of beer per week     Comment: 3-4 beers per night X 20 years  . Drug Use: Yes    Special: Marijuana  . Sexual Activity: Not on file   Other Topics Concern  . Not on file   Social History Narrative   The patient is single. The patient is a Geophysicist/field seismologist.   The patient moved from [place], [place] to [place] in March of 2013.   The patient is a [employer] and [employer].         Review of Systems   Constitutional: Negative for fever and chills.  HENT: Negative for trouble swallowing.   Respiratory: Negative for cough and shortness of breath.   Cardiovascular: Negative for chest pain.  Gastrointestinal: Negative for nausea, vomiting, abdominal pain and blood in stool.  Genitourinary: Negative for dysuria and hematuria.  Musculoskeletal: Negative for back pain.  Neurological: Negative for headaches.  Hematological: Does not bruise/bleed easily.  Vital Signs: BP 131/75 mmHg  Pulse 60  Temp(Src) 98.5 F (36.9 C) (Oral)  Resp 18  SpO2 100%  Physical Exam  Constitutional: He is oriented to person, place, and time. He appears well-developed and well-nourished.  Cardiovascular: Normal rate and regular rhythm.   Pulmonary/Chest: Effort normal and breath sounds normal.  Abdominal: Soft. Bowel sounds are normal. There is no tenderness.  Musculoskeletal: Normal range of motion. He exhibits no edema.  Neurological: He is alert and oriented to person, place, and time.    Imaging: No results found.  Labs:  CBC:  Recent Labs  04/12/14 1504  05/21/14 0953  WBC 5.7 4.6  HGB 13.2 13.3  HCT 37.7* 39.5  PLT 189 191    COAGS:  Recent Labs  05/21/14 1300  INR 1.01  APTT 24    BMP:  Recent Labs  05/13/14 1333 05/21/14 0954  NA  --  139  K  --  4.1  CO2  --  30*  GLUCOSE  --  100  BUN 11.7 9.4  CALCIUM  --  9.5  CREATININE 1.0 0.8    LIVER FUNCTION TESTS:  Recent Labs  05/21/14 0954  BILITOT 0.54  AST 22  ALT 16  ALKPHOS 66  PROT 7.1  ALBUMIN 3.9    TUMOR MARKERS: No results for input(s): AFPTM, CEA, CA199, CHROMGRNA in the last 8760 hours.  Assessment and Plan: [name] is a 63 y.o. 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 Details/risks of procedures d/w pt with his understanding and consent.           Signed: Autumn Messing 05/21/2014, 2:30 PM

## 2014-05-21 NOTE — Procedures (Signed)
Procedure: Porta-cath and gastrostomy tube placement Findings:  SL Power Port placed via right IJ vein.  Tip at cavoatrial junction.  OK to use.  No PTX. 70 Fr bumper retention gastrostomy placed with tip in body of stomach.  OK to use in 24 hrs, if needed.

## 2014-05-21 NOTE — Progress Notes (Signed)
To provide support and encouragement, care continuity and to assess for needs, met with patient during appt with Dr. Gorsuch and then later in Short Stay prior to PAC and G-tube placement.  I explained that he can expect a call from [facility] and scheduling of a home visit to explain G-tube care and use, explained that we will f/u with additional instruction during his visits at [facility].  He verbalized understanding.  Rick Diehl, RN, BSN, CHPN Head & Neck Oncology Navigator [facility] [place] [phone]   

## 2014-05-21 NOTE — Progress Notes (Signed)
[name] OFFICE PROGRESS NOTE  Patient Care Team: Pcp Not In System as PCP - General Brooks Sailors, RN as Oncology Nurse Navigator Eppie Gibson, MD as Attending Physician (Radiation Oncology) Karie Mainland, RD as Dietitian (Nutrition) Heath Lark, MD as Consulting Physician (Hematology and Oncology)  SUMMARY OF ONCOLOGIC HISTORY: Oncology History   Cancer of Right Tonsil, HPV positive   Staging form: Pharynx - Oropharynx, AJCC 7th Edition     Clinical stage from 05/21/2014: Stage IVA (T2, N2, M0) - Signed by Heath Lark, MD on 05/21/2014        Cancer of Right Tonsil   03/15/2014 Imaging Ct scan of neck showed bulky adenopathy in the right neck, likely metastatic squamous cell carcinoma.   03/20/2014 Procedure Accession: [id number] FNA of right neck lymph node is non-diagnostic   03/29/2014 Imaging PET/CT showed mild uptake bilateral tonsil and extensive right cervical chain lymphadenopathy with malignant range hypermetabolism   04/19/2014 Surgery Direct laryngoscopy was normal and tonsillectomy was performed   04/19/2014 Pathology Results Accession: [id number] right tonsil showed squamous cell carcinoma, P16 positive   05/21/2014 Procedure the patient has placement of port and feeding tube.    INTERVAL HISTORY: Please see below for problem oriented charting. He feels well. Denies pain. He is ready to start treatment.  REVIEW OF SYSTEMS:   Constitutional: Denies fevers, chills or abnormal weight loss Eyes: Denies blurriness of vision Ears, nose, mouth, throat, and face: Denies mucositis or sore throat Respiratory: Denies cough, dyspnea or wheezes Cardiovascular: Denies palpitation, chest discomfort or lower extremity swelling Gastrointestinal:  Denies nausea, heartburn or change in bowel habits Skin: Denies abnormal skin rashes Lymphatics: Denies new lymphadenopathy or easy bruising Neurological:Denies numbness, tingling or new weaknesses Behavioral/Psych:  Mood is stable, no new changes  All other systems were reviewed with the patient and are negative.  I have reviewed the past medical history, past surgical history, social history and family history with the patient and they are unchanged from previous note.  ALLERGIES:  has No Known Allergies.  MEDICATIONS:  Current Outpatient Prescriptions  Medication Sig Dispense Refill  . HYDROcodone-acetaminophen (NORCO/VICODIN) 5-325 MG per tablet Take 1 tablet by mouth every 6 (six) hours as needed for moderate pain.    Marland Kitchen lidocaine-prilocaine (EMLA) cream Apply to affected area once 30 g 3  . ondansetron (ZOFRAN) 8 MG tablet Take 1 tablet (8 mg total) by mouth every 8 (eight) hours as needed. 30 tablet 1  . oxyCODONE (OXY IR/ROXICODONE) 5 MG immediate release tablet Take 1 tablet (5 mg total) by mouth every 4 (four) hours as needed for severe pain. 30 tablet 0  . prochlorperazine (COMPAZINE) 10 MG tablet Take 1 tablet (10 mg total) by mouth every 6 (six) hours as needed (Nausea or vomiting). 30 tablet 1   No current facility-administered medications for this visit.    PHYSICAL EXAMINATION: ECOG PERFORMANCE STATUS: 0 - Asymptomatic  Filed Vitals:   05/21/14 1010  BP: 132/74  Pulse: 55  Temp: 98.5 F (36.9 C)  Resp: 18   Filed Weights   05/21/14 1010  Weight: 195 lb 4.8 oz (88.587 kg)    GENERAL:alert, no distress and comfortable SKIN: skin color, texture, turgor are normal, no rashes or significant lesions EYES: normal, Conjunctiva are pink and non-injected, sclera clear OROPHARYNX:no exudate, no erythema and lips, buccal mucosa, and tongue normal  NECK: persistent neck masses noted. Musculoskeletal:no cyanosis of digits and no clubbing  NEURO: alert & oriented x 3 with fluent  speech, no focal motor/sensory deficits  LABORATORY DATA:  I have reviewed the data as listed    Component Value Date/Time   BUN 11.7 05/13/2014 1333   CREATININE 1.0 05/13/2014 1333    No results found  for: SPEP, UPEP  Lab Results  Component Value Date   WBC 4.6 05/21/2014   NEUTROABS 2.9 05/21/2014   HGB 13.3 05/21/2014   HCT 39.5 05/21/2014   MCV 97.7 05/21/2014   PLT 191 05/21/2014      Chemistry      Component Value Date/Time   BUN 11.7 05/13/2014 1333   CREATININE 1.0 05/13/2014 1333   No results found for: CALCIUM, ALKPHOS, AST, ALT, BILITOT    ASSESSMENT & PLAN:  Cancer of Right Tonsil We discussed the role of chemotherapy. The intent is for cure.  We discussed some of the risks, benefits, side-effects of cisplatin. Some of the short term side-effects included, though not limited to, including weight loss, life threatening infections, risk of allergic reactions, need for transfusions of blood products, nausea, vomiting, change in bowel habits, loss of hair, admission to hospital for various reasons, and risks of death.   Long term side-effects are also discussed including risks of infertility, permanent damage to nerve function, hearing loss, chronic fatigue, kidney damage with possibility needing hemodialysis, and rare secondary malignancy including bone marrow disorders.  The patient is aware that the response rates discussed earlier is not guaranteed.  After a long discussion, patient made an informed decision to proceed with the prescribed plan of care and he agreed to go ahead with prescribed plan of care. I will see him next week for weekly supportive care visit     Orders Placed This Encounter  Procedures  . CBC with Differential    Standing Status: Standing     Number of Occurrences: 20     Standing Expiration Date: 05/22/2015  . Comprehensive metabolic panel    Standing Status: Standing     Number of Occurrences: 9     Standing Expiration Date: 05/22/2015   All questions were answered. The patient knows to call the clinic with any problems, questions or concerns. No barriers to learning was detected. I spent 30 minutes counseling the patient face to  face. The total time spent in the appointment was 40 minutes and more than 50% was on counseling and review of test results     [facility], [place], MD 05/21/2014 10:43 AM

## 2014-05-21 NOTE — Assessment & Plan Note (Signed)
We discussed the role of chemotherapy. The intent is for cure.  We discussed some of the risks, benefits, side-effects of cisplatin. Some of the short term side-effects included, though not limited to, including weight loss, life threatening infections, risk of allergic reactions, need for transfusions of blood products, nausea, vomiting, change in bowel habits, loss of hair, admission to hospital for various reasons, and risks of death.   Long term side-effects are also discussed including risks of infertility, permanent damage to nerve function, hearing loss, chronic fatigue, kidney damage with possibility needing hemodialysis, and rare secondary malignancy including bone marrow disorders.  The patient is aware that the response rates discussed earlier is not guaranteed.  After a long discussion, patient made an informed decision to proceed with the prescribed plan of care and he agreed to go ahead with prescribed plan of care. I will see him next week for weekly supportive care visit

## 2014-05-22 ENCOUNTER — Encounter: Payer: Self-pay | Admitting: *Deleted

## 2014-05-22 ENCOUNTER — Ambulatory Visit
Admission: RE | Admit: 2014-05-22 | Discharge: 2014-05-22 | Disposition: A | Discharge status: Home / Self Care | Payer: BC Managed Care – PPO | Source: Ambulatory Visit | Attending: Radiation Oncology | Admitting: Radiation Oncology

## 2014-05-22 ENCOUNTER — Encounter: Payer: Self-pay | Admitting: General Practice

## 2014-05-22 ENCOUNTER — Ambulatory Visit (HOSPITAL_BASED_OUTPATIENT_CLINIC_OR_DEPARTMENT_OTHER): Payer: BC Managed Care – PPO

## 2014-05-22 DIAGNOSIS — C099 Malignant neoplasm of tonsil, unspecified: Secondary | ICD-10-CM

## 2014-05-22 DIAGNOSIS — Z5111 Encounter for antineoplastic chemotherapy: Secondary | ICD-10-CM

## 2014-05-22 DIAGNOSIS — Z510 Encounter for antineoplastic radiation therapy: Secondary | ICD-10-CM | POA: Diagnosis not present

## 2014-05-22 MED ORDER — DEXAMETHASONE SODIUM PHOSPHATE 20 MG/5ML IJ SOLN
INTRAMUSCULAR | Status: AC
  Filled 2014-05-22: qty 5

## 2014-05-22 MED ORDER — SODIUM CHLORIDE 0.9 % IV SOLN
100.0000 mg/m2 | Freq: Once | INTRAVENOUS | Status: AC
  Administered 2014-05-22: 214 mg via INTRAVENOUS
  Filled 2014-05-22: qty 214

## 2014-05-22 MED ORDER — SODIUM CHLORIDE 0.9 % IJ SOLN
10.0000 mL | INTRAMUSCULAR | Status: DC | PRN
  Administered 2014-05-22: 10 mL
  Filled 2014-05-22: qty 10

## 2014-05-22 MED ORDER — SODIUM CHLORIDE 0.9 % IV SOLN
150.0000 mg | Freq: Once | INTRAVENOUS | Status: AC
  Administered 2014-05-22: 150 mg via INTRAVENOUS
  Filled 2014-05-22: qty 5

## 2014-05-22 MED ORDER — SODIUM CHLORIDE 0.9 % IV SOLN
Freq: Once | INTRAVENOUS | Status: AC
  Administered 2014-05-22: 09:00:00 via INTRAVENOUS

## 2014-05-22 MED ORDER — HEPARIN SOD (PORK) LOCK FLUSH 100 UNIT/ML IV SOLN
500.0000 [IU] | Freq: Once | INTRAVENOUS | Status: AC | PRN
  Administered 2014-05-22: 500 [IU]
  Filled 2014-05-22: qty 5

## 2014-05-22 MED ORDER — PALONOSETRON HCL INJECTION 0.25 MG/5ML
0.2500 mg | Freq: Once | INTRAVENOUS | Status: AC
  Administered 2014-05-22: 0.25 mg via INTRAVENOUS

## 2014-05-22 MED ORDER — POTASSIUM CHLORIDE 2 MEQ/ML IV SOLN
Freq: Once | INTRAVENOUS | Status: AC
  Administered 2014-05-22: 09:00:00 via INTRAVENOUS
  Filled 2014-05-22: qty 10

## 2014-05-22 MED ORDER — PALONOSETRON HCL INJECTION 0.25 MG/5ML
INTRAVENOUS | Status: AC
  Filled 2014-05-22: qty 5

## 2014-05-22 MED ORDER — DEXAMETHASONE SODIUM PHOSPHATE 20 MG/5ML IJ SOLN
12.0000 mg | Freq: Once | INTRAMUSCULAR | Status: AC
  Administered 2014-05-22: 12 mg via INTRAVENOUS

## 2014-05-22 NOTE — Progress Notes (Signed)
Met Nylan in chemo class, providing spiritual companionship as he shared about the role of art in his life.  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  He noted the variety of Healing Arts offerings through [facility], including events on campus and through [employer], verbalizing appreciation and interest in making use of such programming in his own coping and healing.  He is aware of ongoing chaplain availability for support, but please also page as needs arise:  [phone].  Thank you.  [place], [place]

## 2014-05-22 NOTE — Patient Instructions (Signed)
[facility] Discharge Instructions for Patients Receiving Chemotherapy  Today you received the following chemotherapy agents cisplatin.  To help prevent nausea and vomiting after your treatment, we encourage you to take your nausea medication compazine until Saturday, then you can use the zofran in addition..   If you develop nausea and vomiting that is not controlled by your nausea medication, call the clinic.   BELOW ARE SYMPTOMS THAT SHOULD BE REPORTED IMMEDIATELY:  *FEVER GREATER THAN 100.5 F  *CHILLS WITH OR WITHOUT FEVER  NAUSEA AND VOMITING THAT IS NOT CONTROLLED WITH YOUR NAUSEA MEDICATION  *UNUSUAL SHORTNESS OF BREATH  *UNUSUAL BRUISING OR BLEEDING  TENDERNESS IN MOUTH AND THROAT WITH OR WITHOUT PRESENCE OF ULCERS  *URINARY PROBLEMS  *BOWEL PROBLEMS  UNUSUAL RASH Items with * indicate a potential emergency and should be followed up as soon as possible.  Feel free to call the clinic you have any questions or concerns. The clinic phone number is [phone].

## 2014-05-22 NOTE — Progress Notes (Signed)
IMRT Device Note    ICD-9-CM ICD-10-CM   1. Cancer of Right Tonsil 146.0 C09.9     9.9 delivered field widths represent one set of IMRT treatment devices. The code is [phone].  -----------------------------------  Eppie Gibson, MD

## 2014-05-22 NOTE — Progress Notes (Signed)
[place] Social Work  Clinical Social Work met with patient in [place] office to review and complete healthcare advance directives.   Mr. Basnett shared he has discussed healthcare advance directives with his closest friend who lives locally, [place].  The patient designated friend, Sallye Ober, as their primary healthcare agent and daughter Tiandre Teall as their secondary agent.  Patient also completed healthcare living will.  Mr. Kai does not wish for life-prolonging measures in the three scenarios identified in the health care living will.  Clinical Social Worker notarized documents and made copies for patient/family. Clinical Social Worker will send documents to medical records to be scanned into patient's chart.  Mr. Spinello is eager to start treatment.  He currently works as a Geophysicist/field seismologist in [employer] and is very interested in utilizing [employer] creative arts/wellness programs to help cope with cancer treatment.  Clinical Social Worker encouraged patient/family to contact with any additional questions or concerns.  Polo Riley, MSW, [place] Worker [facility] [phone]

## 2014-05-22 NOTE — Progress Notes (Signed)
Patient feeling lightheaded and dizzy this am.  Feels like he has been through a lot these last few days.  Applied cold washcloth to head and neck and patient is feeling better.  Aundria Mems RN here to see patient. Discussed aromatherapy with patient and gave him some peppermint oil to use aromatically and he said this helped.   Patient states he is now ready to start his treatment.. At end of treatment, patient states he is feeling well.  He requested resources for aromatherapy and gave him one local and two on-line.

## 2014-05-23 ENCOUNTER — Telehealth: Payer: Self-pay | Admitting: *Deleted

## 2014-05-23 ENCOUNTER — Ambulatory Visit
Admission: RE | Admit: 2014-05-23 | Discharge: 2014-05-23 | Disposition: A | Discharge status: Home / Self Care | Payer: BC Managed Care – PPO | Source: Ambulatory Visit | Attending: Radiation Oncology | Admitting: Radiation Oncology

## 2014-05-23 DIAGNOSIS — Z510 Encounter for antineoplastic radiation therapy: Secondary | ICD-10-CM | POA: Diagnosis not present

## 2014-05-23 NOTE — Telephone Encounter (Signed)
Left VM for patient to call office and ask for triage to discuss his status, post new CDDP

## 2014-05-23 NOTE — Telephone Encounter (Signed)
PT. IS EATING AND FORCING FLUIDS. HE HAS HAD NAUSEA WHICH THE COMPAZINE RELIEVES. PT. IS PASSING HIS URINE NORMALLY. NO DIARRHEA OR CONSTIPATION. LAST GOOD BOWEL MOVEMENT WAS TWO DAYS AGO. DISCUSSED OVER THE COUNTER MEDICATIONS PT. MAY USE FOR CONSTIPATION IF NECESSARY. NO MOUTH ISSUES. INSTRUCTED PT. TO CALL THIS OFFICE IF ANY PROBLEMS OR CONCERNS ARISE. REMINDED PT. THERE IS A PHYSICIAN ON CALL 24/7. HE VOICES UNDERSTANDING.

## 2014-05-24 ENCOUNTER — Telehealth: Payer: Self-pay | Admitting: *Deleted

## 2014-05-24 ENCOUNTER — Ambulatory Visit
Admission: RE | Admit: 2014-05-24 | Discharge: 2014-05-24 | Disposition: A | Discharge status: Home / Self Care | Payer: BC Managed Care – PPO | Source: Ambulatory Visit | Attending: Radiation Oncology | Admitting: Radiation Oncology

## 2014-05-24 DIAGNOSIS — Z510 Encounter for antineoplastic radiation therapy: Secondary | ICD-10-CM | POA: Diagnosis not present

## 2014-05-24 NOTE — Telephone Encounter (Signed)
Pt left VM asking if ok to take ibuprofen?  Called pt back and left VM for him to return nurse's call.

## 2014-05-24 NOTE — Telephone Encounter (Signed)
Opened duplicate note in error

## 2014-05-24 NOTE — Progress Notes (Signed)
To provide support and encouragement, met with patient during his first chemo infusion and new start tomotherapy. He tolerated procedures well, did not expres conerns.  I reminded him that he can expect a call from his mentor later this week.  Gayleen Orem, RN, BSN, [place] at [place] [phone]

## 2014-05-27 ENCOUNTER — Ambulatory Visit
Admission: RE | Admit: 2014-05-27 | Discharge: 2014-05-27 | Disposition: A | Discharge status: Home / Self Care | Payer: BC Managed Care – PPO | Source: Ambulatory Visit | Attending: Radiation Oncology | Admitting: Radiation Oncology

## 2014-05-27 ENCOUNTER — Ambulatory Visit: Payer: Self-pay

## 2014-05-27 ENCOUNTER — Ambulatory Visit: Payer: BC Managed Care – PPO

## 2014-05-27 ENCOUNTER — Encounter: Payer: Self-pay | Admitting: Radiation Oncology

## 2014-05-27 VITALS — BP 139/85 | HR 81 | Temp 97.3°F | Resp 12 | Wt 186.6 lb

## 2014-05-27 DIAGNOSIS — Z510 Encounter for antineoplastic radiation therapy: Secondary | ICD-10-CM | POA: Diagnosis present

## 2014-05-27 DIAGNOSIS — C099 Malignant neoplasm of tonsil, unspecified: Secondary | ICD-10-CM | POA: Diagnosis not present

## 2014-05-27 MED ORDER — BIAFINE EX EMUL
Freq: Two times a day (BID) | CUTANEOUS | Status: DC
  Administered 2014-05-27: 18:00:00 via TOPICAL

## 2014-05-27 NOTE — Progress Notes (Addendum)
He is currently in no pain. Pt complains of, Loss of Sleep, Fatigue, Generalized Weakness and Poor Appetite. Pt reports feeling "feverish" however doesn't have a thermometer and temp was normal range today. Pt reports he has lost approx 9lbs, unable to view previous weights. Pt reports he hasn't been eating well due to starting chemo. He has been nauseated and vomited for 5 days.  Today is the first day he hasn't been nauseated or vomited.  Pt denies dysphagia. PO Diet: Regular, eating a bland diet d/t nausea . Oral exam reveals mucous membranes moist, pharynx normal without lesions. Skin noted slight redness to neck. Pt reports he hasn't had a bowel movement in 6 days. Pt reports taking benefiber, but he vomited it up.

## 2014-05-27 NOTE — Progress Notes (Signed)
   Weekly Management Note:  outpatient    ICD-9-CM ICD-10-CM   1. Cancer of Right Tonsil 146.0 C09.9 topical emolient (BIAFINE) emulsion    Current Dose:  8 Gy  Projected Dose: 70 Gy   Narrative:  The patient presents for routine under treatment assessment.  CBCT/MVCT images/Port film x-rays were reviewed.  The chart was checked. Patient appreciative that my collaboration with Dr Loyal Gambler spared him from dental extractions.  No acute effects appreciated from RT so far (except adenopathy shrinking).  Physical Findings:  weight is 186 lb 9.6 oz (84.641 kg). His oral temperature is 97.3 F (36.3 C). His blood pressure is 139/85 and his pulse is 81. His respiration is 12 and oxygen saturation is 100%.  NAD, right upper neck swelling present.  No mucositis or thrush  CBC    Component Value Date/Time   WBC 4.6 05/21/2014 0953   WBC 5.7 04/12/2014 1504   RBC 4.05* 05/21/2014 0953   RBC 3.99* 04/12/2014 1504   HGB 13.3 05/21/2014 0953   HGB 13.2 04/12/2014 1504   HCT 39.5 05/21/2014 0953   HCT 37.7* 04/12/2014 1504   PLT 191 05/21/2014 0953   PLT 189 04/12/2014 1504   MCV 97.7 05/21/2014 0953   MCV 94.5 04/12/2014 1504   MCH 32.7 05/21/2014 0953   MCH 33.1 04/12/2014 1504   MCHC 33.5 05/21/2014 0953   MCHC 35.0 04/12/2014 1504   RDW 12.5 05/21/2014 0953   RDW 11.9 04/12/2014 1504   LYMPHSABS 1.2 05/21/2014 0953   MONOABS 0.5 05/21/2014 0953   EOSABS 0.0 05/21/2014 0953   BASOSABS 0.0 05/21/2014 0953     CMP     Component Value Date/Time   NA 139 05/21/2014 0954   K 4.1 05/21/2014 0954   CO2 30* 05/21/2014 0954   GLUCOSE 100 05/21/2014 0954   BUN 9.4 05/21/2014 0954   CREATININE 0.8 05/21/2014 0954   CALCIUM 9.5 05/21/2014 0954   PROT 7.1 05/21/2014 0954   ALBUMIN 3.9 05/21/2014 0954   AST 22 05/21/2014 0954   ALT 16 05/21/2014 0954   ALKPHOS 66 05/21/2014 0954   BILITOT 0.54 05/21/2014 0954     Impression:  The patient is tolerating radiotherapy.   Plan:   Continue radiotherapy as planned.   -----------------------------------  Eppie Gibson, MD

## 2014-05-27 NOTE — Progress Notes (Signed)
Pt here for patient teaching.  Pt given Radiation and You booklet.  Reviewed and flagged areas of pertinence such as mouth, skin, throat changes, hair loss, fatigue as well as earaches and taste changes.  Pt given Biafine cream with instruction to use Bid and to keep skin free of product 4 hrs of treatment.  Suggested using an electric razor instead of straight.  Pt able to give teach back of how frequently to use Biafine, skin care, and to drink plenty of water.  Pt states the feel comfortable with the information given and will call nursing with any questions or concerns.

## 2014-05-28 ENCOUNTER — Encounter: Payer: Self-pay | Admitting: *Deleted

## 2014-05-28 ENCOUNTER — Telehealth: Payer: Self-pay | Admitting: *Deleted

## 2014-05-28 ENCOUNTER — Ambulatory Visit
Admission: RE | Admit: 2014-05-28 | Discharge: 2014-05-28 | Disposition: A | Discharge status: Home / Self Care | Payer: BC Managed Care – PPO | Source: Ambulatory Visit | Attending: Radiation Oncology | Admitting: Radiation Oncology

## 2014-05-28 DIAGNOSIS — Z510 Encounter for antineoplastic radiation therapy: Secondary | ICD-10-CM | POA: Diagnosis not present

## 2014-05-28 NOTE — Telephone Encounter (Signed)
Mr. Kervin called today with reports of constipation and the inability to sleep, with only 5 hours of sleep within the past 2 days.  He was advised to take Miralax to assist with constipation and Dr. Arloa Koh suggested he take Benadryl 25-50 mg po to aid in sleep.  Requested that Mr. [place] notify this RN, on tomorrow, of results of both interventions and he stated he would.  Will leave a message for the therapist to send to nursing on tomorrow.

## 2014-05-28 NOTE — Progress Notes (Signed)
To provide support and encouragement, care continuity and to assess for needs, met with patient s/p RT.  He: 1. Shared his conversation with RN Malachy Mood re: steps to relieve constipation, he understands to notify if not successful. 2. Provided me FMLA and work disability forms which I delivered to [employer]. He denied additional needs, understands he can contact me should that change.  Gayleen Orem, RN, BSN, [place] at [place] [phone]

## 2014-05-29 ENCOUNTER — Ambulatory Visit
Admission: RE | Admit: 2014-05-29 | Discharge: 2014-05-29 | Disposition: A | Discharge status: Home / Self Care | Payer: BC Managed Care – PPO | Source: Ambulatory Visit | Attending: Radiation Oncology | Admitting: Radiation Oncology

## 2014-05-29 ENCOUNTER — Encounter: Payer: Self-pay | Admitting: Radiation Oncology

## 2014-05-29 DIAGNOSIS — Z510 Encounter for antineoplastic radiation therapy: Secondary | ICD-10-CM | POA: Diagnosis not present

## 2014-05-29 NOTE — Progress Notes (Signed)
Forwarded FMLA and disability paperwork to RN for processing.

## 2014-05-30 ENCOUNTER — Telehealth: Payer: Self-pay | Admitting: Hematology and Oncology

## 2014-05-30 ENCOUNTER — Ambulatory Visit (HOSPITAL_BASED_OUTPATIENT_CLINIC_OR_DEPARTMENT_OTHER): Payer: BC Managed Care – PPO | Admitting: Hematology and Oncology

## 2014-05-30 ENCOUNTER — Ambulatory Visit
Admission: RE | Admit: 2014-05-30 | Discharge: 2014-05-30 | Disposition: A | Discharge status: Home / Self Care | Payer: BC Managed Care – PPO | Source: Ambulatory Visit | Attending: Radiation Oncology | Admitting: Radiation Oncology

## 2014-05-30 ENCOUNTER — Encounter: Payer: Self-pay | Admitting: *Deleted

## 2014-05-30 ENCOUNTER — Encounter: Payer: Self-pay | Admitting: Hematology and Oncology

## 2014-05-30 VITALS — BP 117/66 | HR 80 | Temp 98.2°F | Resp 18 | Ht 74.0 in | Wt 184.2 lb

## 2014-05-30 DIAGNOSIS — Z09 Encounter for follow-up examination after completed treatment for conditions other than malignant neoplasm: Secondary | ICD-10-CM | POA: Insufficient documentation

## 2014-05-30 DIAGNOSIS — E46 Unspecified protein-calorie malnutrition: Secondary | ICD-10-CM | POA: Insufficient documentation

## 2014-05-30 DIAGNOSIS — H9193 Unspecified hearing loss, bilateral: Secondary | ICD-10-CM | POA: Insufficient documentation

## 2014-05-30 DIAGNOSIS — C099 Malignant neoplasm of tonsil, unspecified: Secondary | ICD-10-CM

## 2014-05-30 DIAGNOSIS — G4700 Insomnia, unspecified: Secondary | ICD-10-CM

## 2014-05-30 DIAGNOSIS — Z510 Encounter for antineoplastic radiation therapy: Secondary | ICD-10-CM | POA: Diagnosis not present

## 2014-05-30 DIAGNOSIS — R112 Nausea with vomiting, unspecified: Secondary | ICD-10-CM | POA: Insufficient documentation

## 2014-05-30 HISTORY — DX: Insomnia, unspecified: G47.00

## 2014-05-30 MED ORDER — ZOLPIDEM TARTRATE 10 MG PO TABS: 10.0000 mg | ORAL_TABLET | Freq: Every evening | ORAL | Status: DC | PRN

## 2014-05-30 NOTE — Progress Notes (Signed)
[place] regarding if they are providing services and pt education for his gastrostomy tube.  Pt is schedule to have a home care visit on 05/31/14.

## 2014-05-30 NOTE — Assessment & Plan Note (Signed)
The feeding tube appears to be functioning well. 

## 2014-05-30 NOTE — Telephone Encounter (Signed)
lvm for pt regarding to 12.1 appt.Marland KitchenMarland KitchenMarland Kitchen

## 2014-05-30 NOTE — Assessment & Plan Note (Signed)
He tolerates treatment well apart from mild expected side effects. He has mild hearing deficit which is suspected from cisplatin and may need dose adjustment next visit. In the meantime, I will continue close follow-up with active supportive care.

## 2014-05-30 NOTE — Assessment & Plan Note (Signed)
He has progressive weight loss due to altered taste sensation and reduced appetite. I recommend he start using feeding tube immediately and I will get nutritionists to follow him up closely.

## 2014-05-30 NOTE — Assessment & Plan Note (Signed)
I recommend trial of over-the-counter Benadryl. If that this not helped, he can fill prescription for Ambien.

## 2014-05-30 NOTE — Progress Notes (Signed)
[facility] OFFICE PROGRESS NOTE  Patient Care Team: Pcp Not In System as PCP - General Brooks Sailors, RN as Oncology Nurse Navigator Eppie Gibson, MD as Attending Physician (Radiation Oncology) Karie Mainland, RD as Dietitian (Nutrition) Heath Lark, MD as Consulting Physician (Hematology and Oncology)  SUMMARY OF ONCOLOGIC HISTORY: Oncology History   Cancer of Right Tonsil, HPV positive   Staging form: Pharynx - Oropharynx, AJCC 7th Edition     Clinical stage from 05/21/2014: Stage IVA (T2, N2, M0) - Signed by Heath Lark, MD on 05/21/2014        Cancer of Right Tonsil   03/15/2014 Imaging Ct scan of neck showed bulky adenopathy in the right neck, likely metastatic squamous cell carcinoma.   03/20/2014 Procedure Accession: [id number] FNA of right neck lymph node is non-diagnostic   03/29/2014 Imaging PET/CT showed mild uptake bilateral tonsil and extensive right cervical chain lymphadenopathy with malignant range hypermetabolism   04/19/2014 Surgery Direct laryngoscopy was normal and tonsillectomy was performed   04/19/2014 Pathology Results Accession: [id number] right tonsil showed squamous cell carcinoma, P16 positive   05/21/2014 Procedure the patient has placement of port and feeding tube.   05/22/2014 -  Chemotherapy He received high-dose cisplatin.   05/22/2014 -  Radiation Therapy He received radiation treatment.     INTERVAL HISTORY: Please see below for problem oriented charting. He is seen as part of his weekly supportive care visit. He has some nausea & vomiting 3 days after chemotherapy, resolved. He complained of reduced hearing after treatment but no major hearing deficit. He has progressive weight loss due to anorexia. He have difficulty sleeping at nighttime. He felt that the neck mass has reduced in size.  REVIEW OF SYSTEMS:   Constitutional: Denies fevers, chills  Eyes: Denies blurriness of vision Ears, nose, mouth, throat, and face: Denies  mucositis or sore throat Respiratory: Denies cough, dyspnea or wheezes Cardiovascular: Denies palpitation, chest discomfort or lower extremity swelling Skin: Denies abnormal skin rashes Lymphatics: Denies new lymphadenopathy or easy bruising Neurological:Denies numbness, tingling or new weaknesses Behavioral/Psych: Mood is stable, no new changes  All other systems were reviewed with the patient and are negative.  I have reviewed the past medical history, past surgical history, social history and family history with the patient and they are unchanged from previous note.  ALLERGIES:  has No Known Allergies.  MEDICATIONS:  Current Outpatient Prescriptions  Medication Sig Dispense Refill  . lidocaine-prilocaine (EMLA) cream Apply to affected area once 30 g 3  . zolpidem (AMBIEN) 10 MG tablet Take 1 tablet (10 mg total) by mouth at bedtime as needed for sleep. 30 tablet 0   No current facility-administered medications for this visit.    PHYSICAL EXAMINATION: ECOG PERFORMANCE STATUS: 1 - Symptomatic but completely ambulatory  Filed Vitals:   05/30/14 1316  BP: 117/66  Pulse: 80  Temp: 98.2 F (36.8 C)  Resp: 18   Filed Weights   05/30/14 1316  Weight: 184 lb 3.2 oz (83.553 kg)    GENERAL:alert, no distress and comfortable SKIN: skin color, texture, turgor are normal, no rashes or significant lesions EYES: normal, Conjunctiva are pink and non-injected, sclera clear OROPHARYNX:no exudate, no erythema and lips, buccal mucosa, and tongue normal . No mucositis or thrush. NECK: supple, thyroid normal size, non-tender, without nodularity LYMPH:  Previously palpable lymphadenopathy has regressed in size. LUNGS: clear to auscultation and percussion with normal breathing effort HEART: regular rate & rhythm and no murmurs and no lower extremity  edema ABDOMEN:abdomen soft, non-tender and normal bowel sounds. Feeding tube site looks okay Musculoskeletal:no cyanosis of digits and no  clubbing  NEURO: alert & oriented x 3 with fluent speech, no focal motor/sensory deficits  LABORATORY DATA:  I have reviewed the data as listed    Component Value Date/Time   NA 139 05/21/2014 0954   K 4.1 05/21/2014 0954   CO2 30* 05/21/2014 0954   GLUCOSE 100 05/21/2014 0954   BUN 9.4 05/21/2014 0954   CREATININE 0.8 05/21/2014 0954   CALCIUM 9.5 05/21/2014 0954   PROT 7.1 05/21/2014 0954   ALBUMIN 3.9 05/21/2014 0954   AST 22 05/21/2014 0954   ALT 16 05/21/2014 0954   ALKPHOS 66 05/21/2014 0954   BILITOT 0.54 05/21/2014 0954    No results found for: SPEP, UPEP  Lab Results  Component Value Date   WBC 4.6 05/21/2014   NEUTROABS 2.9 05/21/2014   HGB 13.3 05/21/2014   HCT 39.5 05/21/2014   MCV 97.7 05/21/2014   PLT 191 05/21/2014      Chemistry      Component Value Date/Time   NA 139 05/21/2014 0954   K 4.1 05/21/2014 0954   CO2 30* 05/21/2014 0954   BUN 9.4 05/21/2014 0954   CREATININE 0.8 05/21/2014 0954      Component Value Date/Time   CALCIUM 9.5 05/21/2014 0954   ALKPHOS 66 05/21/2014 0954   AST 22 05/21/2014 0954   ALT 16 05/21/2014 0954   BILITOT 0.54 05/21/2014 0954      ASSESSMENT & PLAN:  Cancer of Right Tonsil He tolerates treatment well apart from mild expected side effects. He has mild hearing deficit which is suspected from cisplatin and may need dose adjustment next visit. In the meantime, I will continue close follow-up with active supportive care.  Insomnia I recommend trial of over-the-counter Benadryl. If that this not helped, he can fill prescription for Ambien.  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 I will reassess next visit. He may need dose adjustment.  S/P gastrostomy tube (G tube) placement, follow-up exam The feeding tube appears to be functioning well.  Protein calorie malnutrition He has progressive weight loss due to altered taste sensation and reduced appetite. I recommend he start using feeding tube  immediately and I will get nutritionists to follow him up closely.  Nausea with vomiting He had mild nausea and vomiting after chemotherapy which resolved with antiemetics. Continue to encourage him to use antiemetics as needed and increase oral fluid intake.   No orders of the defined types were placed in this encounter.   All questions were answered. The patient knows to call the clinic with any problems, questions or concerns. No barriers to learning was detected. I spent 30 minutes counseling the patient face to face. The total time spent in the appointment was 40 minutes and more than 50% was on counseling and review of test results     [facility], [place], MD 05/30/2014 1:35 PM

## 2014-05-30 NOTE — Assessment & Plan Note (Signed)
He has mild hearing deficit from cisplatin. I will reassess next visit. He may need dose adjustment.

## 2014-05-30 NOTE — Assessment & Plan Note (Signed)
He had mild nausea and vomiting after chemotherapy which resolved with antiemetics. Continue to encourage him to use antiemetics as needed and increase oral fluid intake.

## 2014-05-31 ENCOUNTER — Encounter: Payer: Self-pay | Admitting: *Deleted

## 2014-05-31 ENCOUNTER — Ambulatory Visit
Admission: RE | Admit: 2014-05-31 | Discharge: 2014-05-31 | Disposition: A | Discharge status: Home / Self Care | Payer: BC Managed Care – PPO | Source: Ambulatory Visit | Attending: Radiation Oncology | Admitting: Radiation Oncology

## 2014-05-31 DIAGNOSIS — Z510 Encounter for antineoplastic radiation therapy: Secondary | ICD-10-CM | POA: Diagnosis not present

## 2014-05-31 NOTE — Progress Notes (Signed)
Met with patient and his dtr who is visiting this weekend from [place], [place], prior to [employer].  He confirmed he: 1. Is not interested in additional home visits by [facility] re: additional feeding tube care/use.  He stated that he is flushing daily successfully, has no concerns re: care.  I assured him we can provide instruction/education as needed. 2. Started bowel regime with first dose of Miralax and senna late this morning. He did not express any concerns or needs, understands he can contact me if that changes.  Gayleen Orem, RN, BSN, [facility] [phone]

## 2014-06-02 ENCOUNTER — Ambulatory Visit
Admission: RE | Admit: 2014-06-02 | Discharge: 2014-06-02 | Disposition: A | Discharge status: Home / Self Care | Payer: BC Managed Care – PPO | Source: Ambulatory Visit | Attending: Radiation Oncology | Admitting: Radiation Oncology

## 2014-06-02 DIAGNOSIS — Z510 Encounter for antineoplastic radiation therapy: Secondary | ICD-10-CM | POA: Diagnosis not present

## 2014-06-03 ENCOUNTER — Ambulatory Visit
Admission: RE | Admit: 2014-06-03 | Discharge: 2014-06-03 | Disposition: A | Discharge status: Home / Self Care | Payer: BC Managed Care – PPO | Source: Ambulatory Visit | Attending: Radiation Oncology | Admitting: Radiation Oncology

## 2014-06-03 ENCOUNTER — Encounter: Payer: Self-pay | Admitting: Radiation Oncology

## 2014-06-03 ENCOUNTER — Encounter: Payer: Self-pay | Admitting: *Deleted

## 2014-06-03 VITALS — BP 120/79 | HR 70 | Temp 97.9°F | Resp 12 | Wt 184.8 lb

## 2014-06-03 DIAGNOSIS — C099 Malignant neoplasm of tonsil, unspecified: Secondary | ICD-10-CM

## 2014-06-03 DIAGNOSIS — Z510 Encounter for antineoplastic radiation therapy: Secondary | ICD-10-CM | POA: Diagnosis not present

## 2014-06-03 MED ORDER — LIDOCAINE VISCOUS 2 % MT SOLN: OROMUCOSAL | Status: DC

## 2014-06-03 MED ORDER — SUCRALFATE 1 G PO TABS: ORAL_TABLET | ORAL | Status: DC

## 2014-06-03 NOTE — Progress Notes (Signed)
He is currently in no pain. Pt complains of, Loss of Sleep and Fatigue. Reports only taking Ambien on a rare occasion. Pt denies dysphagia. PO Diet: Regular. Oral exam reveals mucous membranes moist, pharynx normal without lesions, denies having a dry mouth.  Pt reports having slightly thick secretions.  Skin hyperpigmentation, erythema noted over neck noted. Pt reports continued use of Biafine.  Pt reports they had a small hard bowel movement, Pt reports he is going to start taking the Miralax more consistently and drinking prune juice.  Encouraged increased water consumption. Pt GT site non tender, pink, no drainage noted.

## 2014-06-03 NOTE — Progress Notes (Signed)
   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  CBCT/MVCT images/Port film x-rays were reviewed.  The chart was checked. He is doing well.  Constipation resolved 2 days ago.  No pain. Right neck node has regressed significantly  Physical Findings:  weight is 184 lb 12.8 oz (83.825 kg). His oral temperature is 97.9 F (36.6 C). His blood pressure is 120/79 and his pulse is 70. His respiration is 12 and oxygen saturation is 100%.  patchy mucositis in mouth, right neck mass has regressed significantly. Skin intact. No thrush.  CBC    Component Value Date/Time   WBC 4.6 05/21/2014 0953   WBC 5.7 04/12/2014 1504   RBC 4.05* 05/21/2014 0953   RBC 3.99* 04/12/2014 1504   HGB 13.3 05/21/2014 0953   HGB 13.2 04/12/2014 1504   HCT 39.5 05/21/2014 0953   HCT 37.7* 04/12/2014 1504   PLT 191 05/21/2014 0953   PLT 189 04/12/2014 1504   MCV 97.7 05/21/2014 0953   MCV 94.5 04/12/2014 1504   MCH 32.7 05/21/2014 0953   MCH 33.1 04/12/2014 1504   MCHC 33.5 05/21/2014 0953   MCHC 35.0 04/12/2014 1504   RDW 12.5 05/21/2014 0953   RDW 11.9 04/12/2014 1504   LYMPHSABS 1.2 05/21/2014 0953   MONOABS 0.5 05/21/2014 0953   EOSABS 0.0 05/21/2014 0953   BASOSABS 0.0 05/21/2014 0953     CMP     Component Value Date/Time   NA 139 05/21/2014 0954   K 4.1 05/21/2014 0954   CO2 30* 05/21/2014 0954   GLUCOSE 100 05/21/2014 0954   BUN 9.4 05/21/2014 0954   CREATININE 0.8 05/21/2014 0954   CALCIUM 9.5 05/21/2014 0954   PROT 7.1 05/21/2014 0954   ALBUMIN 3.9 05/21/2014 0954   AST 22 05/21/2014 0954   ALT 16 05/21/2014 0954   ALKPHOS 66 05/21/2014 0954   BILITOT 0.54 05/21/2014 0954     Impression:  The patient is tolerating radiotherapy.   Plan:   Continue radiotherapy as planned. See Rx above. Given for anticipated odynophagia in future.    -----------------------------------  Eppie Gibson, MD

## 2014-06-04 ENCOUNTER — Ambulatory Visit
Admission: RE | Admit: 2014-06-04 | Discharge: 2014-06-04 | Disposition: A | Discharge status: Home / Self Care | Payer: BC Managed Care – PPO | Source: Ambulatory Visit | Attending: Radiation Oncology | Admitting: Radiation Oncology

## 2014-06-04 DIAGNOSIS — Z510 Encounter for antineoplastic radiation therapy: Secondary | ICD-10-CM | POA: Diagnosis not present

## 2014-06-04 NOTE — Progress Notes (Signed)
To provide support and encouragement, care continuity and to assess for needs, met with patient after RT and during weekly UT with Dr. Isidore Moos.  He reported he: 1. Had BM Saturday morning with aid of Miralax, senna and prune juice.  Plans to take Miralax daily to ensure daily BMs which is his norm. 2. Had a good visit with his dtr from [place], [place], this past weekend. It was helpful for her to accompany him last Friday during his tomo tmt to better understand his tmt regime. 3. Missed his appt with Garald Balding, Speech, last week d/t chemo SEs, needs to reschedule.  I indicated I would contact Glendell Docker to arrange. 4. Talked with his mentor yesterday, plans to meet him tomorrow.  "He's great.  I felt like we were long time friends by the time we finished talking." He denied additional needs/concerns, understands he can contact me should that change.  Gayleen Orem, RN, BSN, [place] at [facility] [phone]

## 2014-06-05 ENCOUNTER — Ambulatory Visit
Admission: RE | Admit: 2014-06-05 | Discharge: 2014-06-05 | Disposition: A | Discharge status: Home / Self Care | Payer: BC Managed Care – PPO | Source: Ambulatory Visit | Attending: Radiation Oncology | Admitting: Radiation Oncology

## 2014-06-05 DIAGNOSIS — Z510 Encounter for antineoplastic radiation therapy: Secondary | ICD-10-CM | POA: Diagnosis not present

## 2014-06-07 ENCOUNTER — Ambulatory Visit: Payer: BC Managed Care – PPO

## 2014-06-10 ENCOUNTER — Ambulatory Visit
Admission: RE | Admit: 2014-06-10 | Discharge: 2014-06-10 | Disposition: A | Discharge status: Home / Self Care | Payer: BC Managed Care – PPO | Source: Ambulatory Visit | Attending: Radiation Oncology | Admitting: Radiation Oncology

## 2014-06-10 ENCOUNTER — Encounter: Payer: Self-pay | Admitting: Radiation Oncology

## 2014-06-10 VITALS — BP 124/79 | HR 64 | Temp 97.9°F | Resp 20 | Ht 74.0 in | Wt 185.6 lb

## 2014-06-10 DIAGNOSIS — Z510 Encounter for antineoplastic radiation therapy: Secondary | ICD-10-CM | POA: Diagnosis not present

## 2014-06-10 DIAGNOSIS — C099 Malignant neoplasm of tonsil, unspecified: Secondary | ICD-10-CM

## 2014-06-10 NOTE — Progress Notes (Signed)
[name] has completed 13 fractions to his right tonsil.  He reports sore throat pain at a 2/10.  He is not currently taking Carafate or lidocaine.  He is wondering if he should take them at the same time.  He denies trouble swallowing, has a good appetite and can eat whatever he wants.  His weight is stable from 06/03/14. He denies a dry mouth.  His peg tube is intact and he is flushing it daily.  He is scheduled for chemotherapy on Wednesday.  He reports fatigue.  The skin on his neck has hyperpigmentation.  He is using biafine.

## 2014-06-10 NOTE — Progress Notes (Signed)
Duplicate entry

## 2014-06-10 NOTE — Progress Notes (Signed)
   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  CBCT/MVCT images/Port film x-rays were reviewed.  The chart was checked. He is doing well.    He reports sore throat pain at a 2/10.  He is not currently taking Carafate or lidocaine.  He is wondering if he should take them at the same time.  He denies trouble swallowing, has a good appetite and can eat whatever he wants.  His weight is stable from 06/03/14. He denies a dry mouth.  His peg tube is intact and he is flushing it daily.  He is scheduled for chemotherapy on Wednesday.  He reports fatigue.  The skin on his neck has hyperpigmentation.  He is using biafine.    No constipation.   Physical Findings:  height is 6\' 2"  (1.88 m) and weight is 185 lb 9.6 oz (84.188 kg). His oral temperature is 97.9 F (36.6 C). His blood pressure is 124/79 and his pulse is 64. His respiration is 20 and oxygen saturation is 98%.  NAD, well appearing, Skin intact over neck  CBC    Component Value Date/Time   WBC 4.6 05/21/2014 0953   WBC 5.7 04/12/2014 1504   RBC 4.05* 05/21/2014 0953   RBC 3.99* 04/12/2014 1504   HGB 13.3 05/21/2014 0953   HGB 13.2 04/12/2014 1504   HCT 39.5 05/21/2014 0953   HCT 37.7* 04/12/2014 1504   PLT 191 05/21/2014 0953   PLT 189 04/12/2014 1504   MCV 97.7 05/21/2014 0953   MCV 94.5 04/12/2014 1504   MCH 32.7 05/21/2014 0953   MCH 33.1 04/12/2014 1504   MCHC 33.5 05/21/2014 0953   MCHC 35.0 04/12/2014 1504   RDW 12.5 05/21/2014 0953   RDW 11.9 04/12/2014 1504   LYMPHSABS 1.2 05/21/2014 0953   MONOABS 0.5 05/21/2014 0953   EOSABS 0.0 05/21/2014 0953   BASOSABS 0.0 05/21/2014 0953     CMP     Component Value Date/Time   NA 139 05/21/2014 0954   K 4.1 05/21/2014 0954   CO2 30* 05/21/2014 0954   GLUCOSE 100 05/21/2014 0954   BUN 9.4  05/21/2014 0954   CREATININE 0.8 05/21/2014 0954   CALCIUM 9.5 05/21/2014 0954   PROT 7.1 05/21/2014 0954   ALBUMIN 3.9 05/21/2014 0954   AST 22 05/21/2014 0954   ALT 16 05/21/2014 0954   ALKPHOS 66 05/21/2014 0954   BILITOT 0.54 05/21/2014 0954     Impression:  The patient is tolerating radiotherapy.   Plan:  Continue radiotherapy as planned.  Described how to use elixirs for throat pain. -----------------------------------  Eppie Gibson, MD

## 2014-06-11 ENCOUNTER — Other Ambulatory Visit (HOSPITAL_BASED_OUTPATIENT_CLINIC_OR_DEPARTMENT_OTHER): Payer: BC Managed Care – PPO

## 2014-06-11 ENCOUNTER — Ambulatory Visit (HOSPITAL_COMMUNITY): Payer: Medicaid - Dental | Admitting: Dentistry

## 2014-06-11 ENCOUNTER — Encounter: Payer: Self-pay | Admitting: Hematology and Oncology

## 2014-06-11 ENCOUNTER — Ambulatory Visit (HOSPITAL_BASED_OUTPATIENT_CLINIC_OR_DEPARTMENT_OTHER): Payer: BC Managed Care – PPO | Admitting: Hematology and Oncology

## 2014-06-11 ENCOUNTER — Ambulatory Visit
Admission: RE | Admit: 2014-06-11 | Discharge: 2014-06-11 | Disposition: A | Discharge status: Home / Self Care | Payer: BC Managed Care – PPO | Source: Ambulatory Visit | Attending: Radiation Oncology | Admitting: Radiation Oncology

## 2014-06-11 VITALS — BP 121/61 | HR 69 | Temp 97.9°F | Resp 18 | Ht 74.0 in | Wt 185.3 lb

## 2014-06-11 VITALS — BP 128/70 | HR 72 | Temp 98.6°F | Wt 185.0 lb

## 2014-06-11 DIAGNOSIS — Z09 Encounter for follow-up examination after completed treatment for conditions other than malignant neoplasm: Secondary | ICD-10-CM

## 2014-06-11 DIAGNOSIS — K1233 Oral mucositis (ulcerative) due to radiation: Secondary | ICD-10-CM

## 2014-06-11 DIAGNOSIS — D630 Anemia in neoplastic disease: Secondary | ICD-10-CM | POA: Insufficient documentation

## 2014-06-11 DIAGNOSIS — Z0189 Encounter for other specified special examinations: Secondary | ICD-10-CM

## 2014-06-11 DIAGNOSIS — D72819 Decreased white blood cell count, unspecified: Secondary | ICD-10-CM

## 2014-06-11 DIAGNOSIS — D6959 Other secondary thrombocytopenia: Secondary | ICD-10-CM | POA: Insufficient documentation

## 2014-06-11 DIAGNOSIS — Z510 Encounter for antineoplastic radiation therapy: Secondary | ICD-10-CM | POA: Diagnosis not present

## 2014-06-11 DIAGNOSIS — C099 Malignant neoplasm of tonsil, unspecified: Secondary | ICD-10-CM

## 2014-06-11 DIAGNOSIS — R432 Parageusia: Secondary | ICD-10-CM

## 2014-06-11 DIAGNOSIS — T50905A Adverse effect of unspecified drugs, medicaments and biological substances, initial encounter: Secondary | ICD-10-CM

## 2014-06-11 DIAGNOSIS — H9193 Unspecified hearing loss, bilateral: Secondary | ICD-10-CM

## 2014-06-11 DIAGNOSIS — K117 Disturbances of salivary secretion: Secondary | ICD-10-CM

## 2014-06-11 DIAGNOSIS — T451X5A Adverse effect of antineoplastic and immunosuppressive drugs, initial encounter: Secondary | ICD-10-CM

## 2014-06-11 DIAGNOSIS — D701 Agranulocytosis secondary to cancer chemotherapy: Secondary | ICD-10-CM | POA: Insufficient documentation

## 2014-06-11 DIAGNOSIS — R682 Dry mouth, unspecified: Secondary | ICD-10-CM

## 2014-06-11 DIAGNOSIS — E46 Unspecified protein-calorie malnutrition: Secondary | ICD-10-CM

## 2014-06-11 LAB — CBC WITH DIFFERENTIAL/PLATELET
BASO%: 0.7 % (ref 0.0–2.0)
Basophils Absolute: 0 10*3/uL (ref 0.0–0.1)
EOS%: 2.1 % (ref 0.0–7.0)
Eosinophils Absolute: 0 10*3/uL (ref 0.0–0.5)
HCT: 32.3 % — ABNORMAL LOW (ref 38.4–49.9)
HGB: 10.8 g/dL — ABNORMAL LOW (ref 13.0–17.1)
LYMPH%: 17.9 % (ref 14.0–49.0)
MCH: 31.9 pg (ref 27.2–33.4)
MCHC: 33.3 g/dL (ref 32.0–36.0)
MCV: 95.9 fL (ref 79.3–98.0)
MONO#: 0.5 10*3/uL (ref 0.1–0.9)
MONO%: 27.1 % — ABNORMAL HIGH (ref 0.0–14.0)
NEUT%: 52.2 % (ref 39.0–75.0)
NEUTROS ABS: 1 10*3/uL — AB (ref 1.5–6.5)
PLATELETS: 122 10*3/uL — AB (ref 140–400)
RBC: 3.37 10*6/uL — AB (ref 4.20–5.82)
RDW: 12.3 % (ref 11.0–14.6)
WBC: 1.8 10*3/uL — AB (ref 4.0–10.3)
lymph#: 0.3 10*3/uL — ABNORMAL LOW (ref 0.9–3.3)

## 2014-06-11 LAB — COMPREHENSIVE METABOLIC PANEL (CC13)
ALBUMIN: 3.5 g/dL (ref 3.5–5.0)
ALK PHOS: 79 U/L (ref 40–150)
ALT: 11 U/L (ref 0–55)
AST: 15 U/L (ref 5–34)
Anion Gap: 7 mEq/L (ref 3–11)
BUN: 12.4 mg/dL (ref 7.0–26.0)
CO2: 28 mEq/L (ref 22–29)
Calcium: 9.5 mg/dL (ref 8.4–10.4)
Chloride: 99 mEq/L (ref 98–109)
Creatinine: 0.8 mg/dL (ref 0.7–1.3)
GLUCOSE: 86 mg/dL (ref 70–140)
POTASSIUM: 3.9 meq/L (ref 3.5–5.1)
SODIUM: 135 meq/L — AB (ref 136–145)
Total Bilirubin: 0.29 mg/dL (ref 0.20–1.20)
Total Protein: 6.6 g/dL (ref 6.4–8.3)

## 2014-06-11 NOTE — Assessment & Plan Note (Signed)
This is likely due to recent treatment. The patient denies recent history of bleeding such as epistaxis, hematuria or hematochezia. He is asymptomatic from the low platelet count. I will observe for now.  he does not require transfusion now. I will delay treatment and reduced dose as above.

## 2014-06-11 NOTE — Assessment & Plan Note (Signed)
He has recent weight loss due to altered taste sensation and reduced appetite. I recommend he start using feeding tube immediately and I will get nutritionists to follow him up closely. However, the patient declined using the feeding tube for now and is still dependent on oral intake

## 2014-06-11 NOTE — Assessment & Plan Note (Signed)
The feeding tube appears to be functioning well. 

## 2014-06-11 NOTE — Assessment & Plan Note (Signed)
This is likely due to recent treatment. The patient denies recent history of fevers, cough, chills, diarrhea or dysuria. He is asymptomatic from the leukopenia. I will observe for now.  I will delay his treatment by 1 week and reduced the dose of cisplatin for next cycle.

## 2014-06-11 NOTE — Assessment & Plan Note (Signed)
He has mild hearing deficit from cisplatin. I plan to reduce the dose of cisplatin for next cycle

## 2014-06-11 NOTE — Assessment & Plan Note (Signed)
He tolerated treatment poorly. He has persistent tinnitus and have severe leukopenia today. I will delay chemotherapy by one week and reduced the dose of cisplatin due to severe leukopenia and hearing loss.

## 2014-06-11 NOTE — Progress Notes (Signed)
06/11/2014  Patient Name:   [name] Date of Birth:   [birth year] Medical Record Number: [id number]  BP 128/70 mmHg  Pulse 72  Temp(Src) 98.6 F (37 C) (Oral)  Wt 185 lb (83.915 kg)  Floyce Stakes presents for oral examination during chemoradiation therapy. Patient has completed 13/35 radiation treatments. Patient has received 1 cycles of chemotherapy.  REVIEW OF CHIEF COMPLAINTS:  DRY MOUTH: Yes HARD TO SWALLOW: No  HURT TO SWALLOW: No TASTE CHANGES: Yes SORES IN MOUTH: Yes TRISMUS: No problems WEIGHT: 185 pounds with a 10 pound weight loss since start of treatment.  HOME OH REGIMEN:  BRUSHING: Twice a day FLOSSING: Every other day RINSING: Patient is not using oral rinses. FLUORIDE: Patient is using fluoride at bedtime. TRISMUS EXERCISES:  Maximum interincisal opening: 50 mm   DENTAL EXAM:  Oral Hygiene:(PLAQUE): Some plaque noted. Oral hygiene improvement was highly suggested. LOCATION OF MUCOSITIS: Right buccal mucosa with oral ulceration noted. DESCRIPTION OF SALIVA: Decreased. Incipient xerostomia. ANY EXPOSED BONE: None noted OTHER WATCHED AREAS: None DX: Xerostomia, Dysgeusia, Accretions and Mucositis  RECOMMENDATIONS: 1. Brush after meals and at bedtime.  Use fluoride at bedtime. 2. Use trismus exercises as directed. 3. Use Biotene Rinse or salt water/baking soda rinses. 4. Multiple sips of water as needed. 5. Return to clinic in two months for oral exam after radiation therapy. Call if problems before then.  Lenn Cal, DDS

## 2014-06-11 NOTE — Progress Notes (Signed)
[place] OFFICE PROGRESS NOTE  Patient Care Team: Pcp Not In System as PCP - General Brooks Sailors, RN as Oncology Nurse Navigator Eppie Gibson, MD as Attending Physician (Radiation Oncology) Karie Mainland, RD as Dietitian (Nutrition) Heath Lark, MD as Consulting Physician (Hematology and Oncology)  SUMMARY OF ONCOLOGIC HISTORY: Oncology History   Cancer of Right Tonsil, HPV positive   Staging form: Pharynx - Oropharynx, AJCC 7th Edition     Clinical stage from 05/21/2014: Stage IVA (T2, N2, M0) - Signed by Heath Lark, MD on 05/21/2014        Cancer of Right Tonsil   03/15/2014 Imaging Ct scan of neck showed bulky adenopathy in the right neck, likely metastatic squamous cell carcinoma.   03/20/2014 Procedure Accession: [id number] FNA of right neck lymph node is non-diagnostic   03/29/2014 Imaging PET/CT showed mild uptake bilateral tonsil and extensive right cervical chain lymphadenopathy with malignant range hypermetabolism   04/19/2014 Surgery Direct laryngoscopy was normal and tonsillectomy was performed   04/19/2014 Pathology Results Accession: [id number] right tonsil showed squamous cell carcinoma, P16 positive   05/21/2014 Procedure the patient has placement of port and feeding tube.   05/22/2014 -  Chemotherapy He received high-dose cisplatin.   05/22/2014 -  Radiation Therapy He received radiation treatment.   06/11/2014 Adverse Reaction Cycle 2 of chemotherapy is delayed due to leukopenia. Dose will be adjusted due to hearing loss/tinnitus as well.    INTERVAL HISTORY: Please see below for problem oriented charting. He is seen today prior to cycle 2 of chemotherapy. He is eating better since I saw him and has not lost further weight. He still has reduced hearing and persistent tinnitus. He complained of fatigue.  REVIEW OF SYSTEMS:   Constitutional: Denies fevers, chills or abnormal weight loss Eyes: Denies blurriness of vision Ears, nose, mouth,  throat, and face: Denies mucositis or sore throat Respiratory: Denies cough, dyspnea or wheezes Cardiovascular: Denies palpitation, chest discomfort or lower extremity swelling Gastrointestinal:  Denies nausea, heartburn or change in bowel habits Skin: Denies abnormal skin rashes Lymphatics: Denies new lymphadenopathy or easy bruising Neurological:Denies numbness, tingling or new weaknesses Behavioral/Psych: Mood is stable, no new changes  All other systems were reviewed with the patient and are negative.  I have reviewed the past medical history, past surgical history, social history and family history with the patient and they are unchanged from previous note.  ALLERGIES:  has No Known Allergies.  MEDICATIONS:  Current Outpatient Prescriptions  Medication Sig Dispense Refill  . diphenhydramine-acetaminophen (TYLENOL PM) 25-500 MG TABS Take 1 tablet by mouth at bedtime as needed.    . lidocaine (XYLOCAINE) 2 % solution Patient: Mix 1part 2% viscous lidocaine, 1part H20. Swish and/or swallow 33mL of this mixture, 47min before meals and at bedtime, up to QID 100 mL 5  . lidocaine-prilocaine (EMLA) cream Apply to affected area once 30 g 3  . polyethylene glycol (MIRALAX / GLYCOLAX) packet Take 17 g by mouth daily as needed.    . senna (SENOKOT) 8.6 MG TABS tablet Take 1 tablet by mouth.    . sucralfate (CARAFATE) 1 G tablet Dissolve 1 tablet in 10 mL H20 and swallow up to QID PRN sore throat 60 tablet 5  . zolpidem (AMBIEN) 10 MG tablet Take 1 tablet (10 mg total) by mouth at bedtime as needed for sleep. 30 tablet 0   No current facility-administered medications for this visit.    PHYSICAL EXAMINATION: ECOG PERFORMANCE STATUS: 1 - Symptomatic  but completely ambulatory  Filed Vitals:   06/11/14 1420  BP: 121/61  Pulse: 69  Temp: 97.9 F (36.6 C)  Resp: 18   Filed Weights   06/11/14 1420  Weight: 185 lb 4.8 oz (84.052 kg)    GENERAL:alert, no distress and comfortable SKIN:  skin color, texture, turgor are normal, no rashes or significant lesions EYES: normal, Conjunctiva are pink and non-injected, sclera clear OROPHARYNX:no exudate, no erythema and lips, buccal mucosa, and tongue normal  NECK: supple, thyroid normal size, non-tender, without nodularity LYMPH:  Previously palpable lymphadenopathy has reduced in size. LUNGS: clear to auscultation and percussion with normal breathing effort HEART: regular rate & rhythm and no murmurs and no lower extremity edema ABDOMEN:abdomen soft, non-tender and normal bowel sounds. Feeding tube site looks okay Musculoskeletal:no cyanosis of digits and no clubbing  NEURO: alert & oriented x 3 with fluent speech, no focal motor/sensory deficits  LABORATORY DATA:  I have reviewed the data as listed    Component Value Date/Time   NA 135* 06/11/2014 1358   K 3.9 06/11/2014 1358   CO2 28 06/11/2014 1358   GLUCOSE 86 06/11/2014 1358   BUN 12.4 06/11/2014 1358   CREATININE 0.8 06/11/2014 1358   CALCIUM 9.5 06/11/2014 1358   PROT 6.6 06/11/2014 1358   ALBUMIN 3.5 06/11/2014 1358   AST 15 06/11/2014 1358   ALT 11 06/11/2014 1358   ALKPHOS 79 06/11/2014 1358   BILITOT 0.29 06/11/2014 1358    No results found for: SPEP, UPEP  Lab Results  Component Value Date   WBC 1.8* 06/11/2014   NEUTROABS 1.0* 06/11/2014   HGB 10.8* 06/11/2014   HCT 32.3* 06/11/2014   MCV 95.9 06/11/2014   PLT 122* 06/11/2014      Chemistry      Component Value Date/Time   NA 135* 06/11/2014 1358   K 3.9 06/11/2014 1358   CO2 28 06/11/2014 1358   BUN 12.4 06/11/2014 1358   CREATININE 0.8 06/11/2014 1358      Component Value Date/Time   CALCIUM 9.5 06/11/2014 1358   ALKPHOS 79 06/11/2014 1358   AST 15 06/11/2014 1358   ALT 11 06/11/2014 1358   BILITOT 0.29 06/11/2014 1358     ASSESSMENT & PLAN:  Cancer of Right Tonsil He tolerated treatment poorly. He has persistent tinnitus and have severe leukopenia today. I will delay  chemotherapy by one week and reduced the dose of cisplatin due to severe leukopenia and hearing loss.  Leukopenia due to antineoplastic chemotherapy This is likely due to recent treatment. The patient denies recent history of fevers, cough, chills, diarrhea or dysuria. He is asymptomatic from the leukopenia. I will observe for now.  I will delay his treatment by 1 week and reduced the dose of cisplatin for next cycle.   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 The patient denies recent history of bleeding such as epistaxis, hematuria or hematochezia. He is asymptomatic from the anemia. I will observe for now.  He does not require transfusion now. I will continue reduce treatment as mentioned above.  Thrombocytopenia due to drugs This is likely due to recent treatment. The patient denies recent history of bleeding such as epistaxis, hematuria or hematochezia. He is asymptomatic from the low platelet count. I will observe for now.  he does not require transfusion now. I will delay treatment and reduced dose as above.   Bilateral hearing loss He has mild hearing deficit from cisplatin. I plan to  reduce the dose of cisplatin for next cycle    Protein calorie malnutrition He has recent weight loss due to altered taste sensation and reduced appetite. I recommend he start using feeding tube immediately and I will get nutritionists to follow him up closely. However, the patient declined using the feeding tube for now and is still dependent on oral intake    S/P gastrostomy tube (G tube) placement, follow-up exam The feeding tube appears to be functioning well.   No orders of the defined types were placed in this encounter.   All questions were answered. The patient knows to call the clinic with any problems, questions or concerns. No barriers to learning was detected. I spent 30 minutes counseling the patient face to face. The total time spent in the appointment  was 40 minutes and more than 50% was on counseling and review of test results     [facility], [place], MD 06/11/2014 8:25 PM

## 2014-06-11 NOTE — Assessment & Plan Note (Signed)
This is likely due to recent treatment. The patient denies recent history of bleeding such as epistaxis, hematuria or hematochezia. He is asymptomatic from the anemia. I will observe for now.  He does not require transfusion now. I will continue reduce treatment as mentioned above.

## 2014-06-12 ENCOUNTER — Ambulatory Visit: Payer: Self-pay

## 2014-06-12 ENCOUNTER — Telehealth: Payer: Self-pay | Admitting: Hematology and Oncology

## 2014-06-12 ENCOUNTER — Encounter: Payer: Self-pay | Admitting: Nutrition

## 2014-06-12 ENCOUNTER — Other Ambulatory Visit: Payer: Self-pay | Admitting: Emergency Medicine

## 2014-06-12 ENCOUNTER — Encounter (HOSPITAL_COMMUNITY): Payer: Self-pay | Admitting: Dentistry

## 2014-06-12 ENCOUNTER — Ambulatory Visit
Admission: RE | Admit: 2014-06-12 | Discharge: 2014-06-12 | Disposition: A | Discharge status: Home / Self Care | Payer: BC Managed Care – PPO | Source: Ambulatory Visit | Attending: Radiation Oncology | Admitting: Radiation Oncology

## 2014-06-12 DIAGNOSIS — Z510 Encounter for antineoplastic radiation therapy: Secondary | ICD-10-CM | POA: Diagnosis not present

## 2014-06-12 NOTE — Progress Notes (Signed)
Patient's chemotherapy was postponed one week.  Patient cancelled nutrition appointment for today.  Will follow up next week during chemotherapy.

## 2014-06-12 NOTE — Telephone Encounter (Signed)
lvm for pt regarding to appt nxt week.Marland KitchenMarland KitchenMarland Kitchen

## 2014-06-12 NOTE — Patient Instructions (Signed)
RECOMMENDATIONS: 1. Brush after meals and at bedtime.  Use fluoride at bedtime. 2. Use trismus exercises as directed. 3. Use Biotene Rinse or salt water/baking soda rinses. 4. Multiple sips of water as needed. 5. Return to clinic in two months for oral exam after radiation therapy. Call if problems before then.  Shena Vinluan F. Michaelangelo Mittelman, DDS   

## 2014-06-13 ENCOUNTER — Ambulatory Visit
Admission: RE | Admit: 2014-06-13 | Discharge: 2014-06-13 | Disposition: A | Discharge status: Home / Self Care | Payer: BC Managed Care – PPO | Source: Ambulatory Visit | Attending: Radiation Oncology | Admitting: Radiation Oncology

## 2014-06-13 DIAGNOSIS — Z510 Encounter for antineoplastic radiation therapy: Secondary | ICD-10-CM | POA: Diagnosis not present

## 2014-06-14 ENCOUNTER — Ambulatory Visit
Admission: RE | Admit: 2014-06-14 | Discharge: 2014-06-14 | Disposition: A | Discharge status: Home / Self Care | Payer: BC Managed Care – PPO | Source: Ambulatory Visit | Attending: Radiation Oncology | Admitting: Radiation Oncology

## 2014-06-14 DIAGNOSIS — Z510 Encounter for antineoplastic radiation therapy: Secondary | ICD-10-CM | POA: Diagnosis not present

## 2014-06-16 ENCOUNTER — Ambulatory Visit: Payer: BC Managed Care – PPO

## 2014-06-17 ENCOUNTER — Ambulatory Visit
Admission: RE | Admit: 2014-06-17 | Discharge: 2014-06-17 | Disposition: A | Discharge status: Home / Self Care | Payer: BC Managed Care – PPO | Source: Ambulatory Visit | Attending: Radiation Oncology | Admitting: Radiation Oncology

## 2014-06-17 VITALS — BP 117/64 | HR 76 | Temp 98.6°F | Resp 10 | Wt 185.4 lb

## 2014-06-17 DIAGNOSIS — C099 Malignant neoplasm of tonsil, unspecified: Secondary | ICD-10-CM

## 2014-06-17 DIAGNOSIS — Z510 Encounter for antineoplastic radiation therapy: Secondary | ICD-10-CM | POA: Diagnosis not present

## 2014-06-17 NOTE — Progress Notes (Signed)
He is currently in no pain. Pt complains of, Fatigue and Generalized Weakness.  Pt denies dysphagia. PO Diet: Regular,soft. Bolus of h20 qd via gt. Oral exam reveals mucous membranes moist, pharynx normal without lesions. Skin hyperpigmentation, erythema, sm area of crusting on right clavicle.  Pt has only been applying Biafine to face, encouraged to use on neck and clavicle bid. Pt reports Pruritus as well.  Pt denies constipation issues.

## 2014-06-17 NOTE — Progress Notes (Signed)
   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  CBCT/MVCT images/Port film x-rays were reviewed.  The chart was checked. He is doing well.  Chemotherapy was held last week due to blood counts, he reports.  No pain. BM's WNL.   Physical Findings:  weight is 185 lb 6.4 oz (84.097 kg). His oral temperature is 98.6 F (37 C). His blood pressure is 117/64 and his pulse is 76. His respiration is 10 and oxygen saturation is 100%.  NAD, well appearing, oropharynx erythematous. Skin dry/erythematous over neck.  Flattening right neck mass/node.  CBC    Component Value Date/Time   WBC 1.8* 06/11/2014 1358   WBC 5.7 04/12/2014 1504   RBC 3.37* 06/11/2014 1358   RBC 3.99* 04/12/2014 1504   HGB 10.8* 06/11/2014 1358   HGB 13.2 04/12/2014 1504   HCT 32.3* 06/11/2014 1358   HCT 37.7* 04/12/2014 1504   PLT 122* 06/11/2014 1358   PLT 189 04/12/2014 1504   MCV 95.9 06/11/2014 1358   MCV 94.5 04/12/2014 1504   MCH 31.9 06/11/2014 1358   MCH 33.1 04/12/2014 1504   MCHC 33.3 06/11/2014 1358   MCHC 35.0 04/12/2014 1504   RDW 12.3 06/11/2014 1358   RDW 11.9 04/12/2014 1504   LYMPHSABS 0.3* 06/11/2014 1358   MONOABS 0.5 06/11/2014 1358   EOSABS 0.0 06/11/2014 1358   BASOSABS 0.0 06/11/2014 1358     CMP     Component Value Date/Time   NA 135* 06/11/2014 1358   K 3.9 06/11/2014 1358   CO2 28 06/11/2014 1358   GLUCOSE 86 06/11/2014 1358   BUN 12.4 06/11/2014 1358   CREATININE 0.8 06/11/2014 1358   CALCIUM 9.5 06/11/2014 1358   PROT 6.6 06/11/2014 1358   ALBUMIN 3.5 06/11/2014 1358   AST 15 06/11/2014 1358   ALT 11 06/11/2014 1358   ALKPHOS 79 06/11/2014 1358   BILITOT 0.29 06/11/2014 1358     Impression:  The patient is tolerating radiotherapy.   Plan:  Continue radiotherapy as planned. Doing well.  Recommended following nursing instructions: Biafine TID over neck. -----------------------------------  Eppie Gibson, MD

## 2014-06-18 ENCOUNTER — Ambulatory Visit (HOSPITAL_BASED_OUTPATIENT_CLINIC_OR_DEPARTMENT_OTHER): Payer: BC Managed Care – PPO | Admitting: Hematology and Oncology

## 2014-06-18 ENCOUNTER — Ambulatory Visit
Admission: RE | Admit: 2014-06-18 | Discharge: 2014-06-18 | Disposition: A | Discharge status: Home / Self Care | Payer: BC Managed Care – PPO | Source: Ambulatory Visit | Attending: Radiation Oncology | Admitting: Radiation Oncology

## 2014-06-18 ENCOUNTER — Telehealth: Payer: Self-pay | Admitting: Hematology and Oncology

## 2014-06-18 ENCOUNTER — Encounter: Payer: Self-pay | Admitting: Hematology and Oncology

## 2014-06-18 ENCOUNTER — Encounter: Payer: Self-pay | Admitting: Radiation Oncology

## 2014-06-18 ENCOUNTER — Other Ambulatory Visit (HOSPITAL_BASED_OUTPATIENT_CLINIC_OR_DEPARTMENT_OTHER): Payer: BC Managed Care – PPO

## 2014-06-18 VITALS — BP 122/69 | HR 67 | Temp 98.0°F | Resp 18 | Ht 74.0 in | Wt 183.5 lb

## 2014-06-18 DIAGNOSIS — D630 Anemia in neoplastic disease: Secondary | ICD-10-CM

## 2014-06-18 DIAGNOSIS — Z510 Encounter for antineoplastic radiation therapy: Secondary | ICD-10-CM | POA: Diagnosis not present

## 2014-06-18 DIAGNOSIS — D701 Agranulocytosis secondary to cancer chemotherapy: Secondary | ICD-10-CM

## 2014-06-18 DIAGNOSIS — H9193 Unspecified hearing loss, bilateral: Secondary | ICD-10-CM

## 2014-06-18 DIAGNOSIS — C099 Malignant neoplasm of tonsil, unspecified: Secondary | ICD-10-CM

## 2014-06-18 DIAGNOSIS — T50905A Adverse effect of unspecified drugs, medicaments and biological substances, initial encounter: Secondary | ICD-10-CM

## 2014-06-18 DIAGNOSIS — K1231 Oral mucositis (ulcerative) due to antineoplastic therapy: Secondary | ICD-10-CM

## 2014-06-18 DIAGNOSIS — D6959 Other secondary thrombocytopenia: Secondary | ICD-10-CM

## 2014-06-18 DIAGNOSIS — Z09 Encounter for follow-up examination after completed treatment for conditions other than malignant neoplasm: Secondary | ICD-10-CM

## 2014-06-18 DIAGNOSIS — D72818 Other decreased white blood cell count: Secondary | ICD-10-CM

## 2014-06-18 DIAGNOSIS — T451X5A Adverse effect of antineoplastic and immunosuppressive drugs, initial encounter: Secondary | ICD-10-CM

## 2014-06-18 DIAGNOSIS — E46 Unspecified protein-calorie malnutrition: Secondary | ICD-10-CM

## 2014-06-18 LAB — CBC WITH DIFFERENTIAL/PLATELET
BASO%: 0 % (ref 0.0–2.0)
Basophils Absolute: 0 10*3/uL (ref 0.0–0.1)
EOS%: 0.6 % (ref 0.0–7.0)
Eosinophils Absolute: 0 10*3/uL (ref 0.0–0.5)
HCT: 29.2 % — ABNORMAL LOW (ref 38.4–49.9)
HGB: 10.2 g/dL — ABNORMAL LOW (ref 13.0–17.1)
LYMPH%: 7.1 % — AB (ref 14.0–49.0)
MCH: 32.6 pg (ref 27.2–33.4)
MCHC: 34.9 g/dL (ref 32.0–36.0)
MCV: 93.3 fL (ref 79.3–98.0)
MONO#: 0.4 10*3/uL (ref 0.1–0.9)
MONO%: 12.2 % (ref 0.0–14.0)
NEUT#: 2.7 10*3/uL (ref 1.5–6.5)
NEUT%: 80.1 % — ABNORMAL HIGH (ref 39.0–75.0)
PLATELETS: 117 10*3/uL — AB (ref 140–400)
RBC: 3.13 10*6/uL — AB (ref 4.20–5.82)
RDW: 11.8 % (ref 11.0–14.6)
WBC: 3.4 10*3/uL — AB (ref 4.0–10.3)
lymph#: 0.2 10*3/uL — ABNORMAL LOW (ref 0.9–3.3)

## 2014-06-18 LAB — COMPREHENSIVE METABOLIC PANEL (CC13)
ALK PHOS: 74 U/L (ref 40–150)
ALT: 12 U/L (ref 0–55)
AST: 14 U/L (ref 5–34)
Albumin: 3.4 g/dL — ABNORMAL LOW (ref 3.5–5.0)
Anion Gap: 9 mEq/L (ref 3–11)
BILIRUBIN TOTAL: 0.29 mg/dL (ref 0.20–1.20)
BUN: 19.3 mg/dL (ref 7.0–26.0)
CO2: 28 mEq/L (ref 22–29)
Calcium: 9.5 mg/dL (ref 8.4–10.4)
Chloride: 101 mEq/L (ref 98–109)
Creatinine: 0.8 mg/dL (ref 0.7–1.3)
EGFR: >90 mL/min/{1.73_m2} (ref >90)
Glucose: 96 mg/dl (ref 70–140)
Potassium: 4.2 mEq/L (ref 3.5–5.1)
Sodium: 138 mEq/L (ref 136–145)
TOTAL PROTEIN: 6.5 g/dL (ref 6.4–8.3)

## 2014-06-18 NOTE — Progress Notes (Signed)
[place] OFFICE PROGRESS NOTE  Patient Care Team: Pcp Not In System as PCP - General Brooks Sailors, RN as Oncology Nurse Navigator Eppie Gibson, MD as Attending Physician (Radiation Oncology) Karie Mainland, RD as Dietitian (Nutrition) Heath Lark, MD as Consulting Physician (Hematology and Oncology)  SUMMARY OF ONCOLOGIC HISTORY: Oncology History   Cancer of Right Tonsil, HPV positive   Staging form: Pharynx - Oropharynx, AJCC 7th Edition     Clinical stage from 05/21/2014: Stage IVA (T2, N2, M0) - Signed by Heath Lark, MD on 05/21/2014        Cancer of Right Tonsil   03/15/2014 Imaging Ct scan of neck showed bulky adenopathy in the right neck, likely metastatic squamous cell carcinoma.   03/20/2014 Procedure Accession: [id number] FNA of right neck lymph node is non-diagnostic   03/29/2014 Imaging PET/CT showed mild uptake bilateral tonsil and extensive right cervical chain lymphadenopathy with malignant range hypermetabolism   04/19/2014 Surgery Direct laryngoscopy was normal and tonsillectomy was performed   04/19/2014 Pathology Results Accession: [id number] right tonsil showed squamous cell carcinoma, P16 positive   05/21/2014 Procedure the patient has placement of port and feeding tube.   05/22/2014 -  Chemotherapy He received high-dose cisplatin.   05/22/2014 -  Radiation Therapy He received radiation treatment.   06/11/2014 Adverse Reaction Cycle 2 of chemotherapy is delayed due to leukopenia. Dose will be adjusted due to hearing loss/tinnitus as well.    INTERVAL HISTORY: Please see below for problem oriented charting. He is seen prior to cycle 2 of therapy. He complained of mild mucositis. His had mild persistent hearing deficits. Denies any nausea. He has lost a bit of weight. Denies dysphagia.  REVIEW OF SYSTEMS:   Constitutional: Denies fevers, chills Eyes: Denies blurriness of vision Respiratory: Denies cough, dyspnea or wheezes Cardiovascular:  Denies palpitation, chest discomfort or lower extremity swelling Gastrointestinal:  Denies nausea, heartburn or change in bowel habits Skin: Denies abnormal skin rashes Lymphatics: Denies new lymphadenopathy or easy bruising Neurological:Denies numbness, tingling or new weaknesses Behavioral/Psych: Mood is stable, no new changes  All other systems were reviewed with the patient and are negative.  I have reviewed the past medical history, past surgical history, social history and family history with the patient and they are unchanged from previous note.  ALLERGIES:  has No Known Allergies.  MEDICATIONS:  Current Outpatient Prescriptions  Medication Sig Dispense Refill  . lidocaine-prilocaine (EMLA) cream Apply to affected area once 30 g 3  . polyethylene glycol (MIRALAX / GLYCOLAX) packet Take 17 g by mouth daily as needed.    . senna (SENOKOT) 8.6 MG TABS tablet Take 1 tablet by mouth.    . zolpidem (AMBIEN) 10 MG tablet Take 1 tablet (10 mg total) by mouth at bedtime as needed for sleep. 30 tablet 0  . lidocaine (XYLOCAINE) 2 % solution Patient: Mix 1part 2% viscous lidocaine, 1part H20. Swish and/or swallow 42mL of this mixture, 62min before meals and at bedtime, up to QID (Patient not taking: Reported on 06/18/2014) 100 mL 5  . sucralfate (CARAFATE) 1 G tablet Dissolve 1 tablet in 10 mL H20 and swallow up to QID PRN sore throat (Patient not taking: Reported on 06/18/2014) 60 tablet 5   No current facility-administered medications for this visit.    PHYSICAL EXAMINATION: ECOG PERFORMANCE STATUS: 1 - Symptomatic but completely ambulatory  Filed Vitals:   06/18/14 1351  BP: 122/69  Pulse: 67  Temp: 98 F (36.7 C)  Resp: 18  Filed Weights   06/18/14 1351  Weight: 183 lb 8 oz (83.235 kg)    GENERAL:alert, no distress and comfortable SKIN: skin color, texture, turgor are normal, no rashes or significant lesions EYES: normal, Conjunctiva are pink and non-injected, sclera  clear OROPHARYNX:no exudate, no erythema and lips, buccal mucosa, and tongue normal . No thrush NECK: supple, thyroid normal size, non-tender, without nodularity LYMPH:  He has persistent lymphadenopathy in the neck, mildly reduced in size.  LUNGS: clear to auscultation and percussion with normal breathing effort HEART: regular rate & rhythm and no murmurs and no lower extremity edema ABDOMEN:abdomen soft, non-tender and normal bowel sounds. Feeding tube site looks okay Musculoskeletal:no cyanosis of digits and no clubbing  NEURO: alert & oriented x 3 with fluent speech, no focal motor/sensory deficits  LABORATORY DATA:  I have reviewed the data as listed    Component Value Date/Time   NA 138 06/18/2014 1300   K 4.2 06/18/2014 1300   CO2 28 06/18/2014 1300   GLUCOSE 96 06/18/2014 1300   BUN 19.3 06/18/2014 1300   CREATININE 0.8 06/18/2014 1300   CALCIUM 9.5 06/18/2014 1300   PROT 6.5 06/18/2014 1300   ALBUMIN 3.4* 06/18/2014 1300   AST 14 06/18/2014 1300   ALT 12 06/18/2014 1300   ALKPHOS 74 06/18/2014 1300   BILITOT 0.29 06/18/2014 1300    No results found for: SPEP, UPEP  Lab Results  Component Value Date   WBC 3.4* 06/18/2014   NEUTROABS 2.7 06/18/2014   HGB 10.2* 06/18/2014   HCT 29.2* 06/18/2014   MCV 93.3 06/18/2014   PLT 117* 06/18/2014      Chemistry      Component Value Date/Time   NA 138 06/18/2014 1300   K 4.2 06/18/2014 1300   CO2 28 06/18/2014 1300   BUN 19.3 06/18/2014 1300   CREATININE 0.8 06/18/2014 1300      Component Value Date/Time   CALCIUM 9.5 06/18/2014 1300   ALKPHOS 74 06/18/2014 1300   AST 14 06/18/2014 1300   ALT 12 06/18/2014 1300   BILITOT 0.29 06/18/2014 1300     ASSESSMENT & PLAN:  Cancer of Right Tonsil He tolerated treatment poorly. He has persistent tinnitus and has severe leukopenia last week, causing delay on the second week of chemotherapy. I will resume his treatment tomorrow and reduced the dose of cisplatin by 25%  due to severe leukopenia and hearing loss.    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 The patient denies recent history of bleeding such as epistaxis, hematuria or hematochezia. He is asymptomatic from the anemia. I will observe for now.  He does not require transfusion now. I will continue reduce treatment as mentioned above.   Bilateral hearing loss He has mild hearing deficit from cisplatin. I plan to reduce the dose of cisplatin for next cycle   Leukopenia due to antineoplastic chemotherapy This is likely due to recent treatment. The patient denies recent history of fevers, cough, chills, diarrhea or dysuria. He is asymptomatic from the leukopenia. I will observe for now.  I have delayed his treatment by 1 week and reduced the dose of cisplatin for next cycle.     Protein calorie malnutrition He has recent weight loss due to altered taste sensation and reduced appetite. I recommend he start using feeding tube immediately and I will get nutritionists to follow him up closely. However, the patient declined using the feeding tube for now and is still dependent on oral  intake    S/P gastrostomy tube (G tube) placement, follow-up exam The feeding tube appears to be functioning well.   Thrombocytopenia due to drugs This is likely due to recent treatment. The patient denies recent history of bleeding such as epistaxis, hematuria or hematochezia. He is asymptomatic from the low platelet count. I will observe for now.  he does not require transfusion now. I will reduce dose as above.     Mucositis due to chemotherapy This is related to side effects of treatment. I will reduce the dose as above. Continue conservative management.   No orders of the defined types were placed in this encounter.   All questions were answered. The patient knows to call the clinic with any problems, questions or concerns. No barriers to learning was detected. I spent 30  minutes counseling the patient face to face. The total time spent in the appointment was 40 minutes and more than 50% was on counseling and review of test results     [facility], [place], MD 06/18/2014 2:26 PM

## 2014-06-18 NOTE — Assessment & Plan Note (Signed)
This is likely due to recent treatment. The patient denies recent history of fevers, cough, chills, diarrhea or dysuria. He is asymptomatic from the leukopenia. I will observe for now.  I have delayed his treatment by 1 week and reduced the dose of cisplatin for next cycle.

## 2014-06-18 NOTE — Assessment & Plan Note (Signed)
The feeding tube appears to be functioning well. 

## 2014-06-18 NOTE — Progress Notes (Signed)
Returned completed FMLA and disability paperwork to patient.  Sent copy for scanning.

## 2014-06-18 NOTE — Assessment & Plan Note (Signed)
He has mild hearing deficit from cisplatin. I plan to reduce the dose of cisplatin for next cycle

## 2014-06-18 NOTE — Assessment & Plan Note (Signed)
This is related to side effects of treatment. I will reduce the dose as above. Continue conservative management.

## 2014-06-18 NOTE — Assessment & Plan Note (Signed)
He tolerated treatment poorly. He has persistent tinnitus and has severe leukopenia last week, causing delay on the second week of chemotherapy. I will resume his treatment tomorrow and reduced the dose of cisplatin by 25% due to severe leukopenia and hearing loss.

## 2014-06-18 NOTE — Assessment & Plan Note (Signed)
He has recent weight loss due to altered taste sensation and reduced appetite. I recommend he start using feeding tube immediately and I will get nutritionists to follow him up closely. However, the patient declined using the feeding tube for now and is still dependent on oral intake

## 2014-06-18 NOTE — Assessment & Plan Note (Signed)
This is likely due to recent treatment. The patient denies recent history of bleeding such as epistaxis, hematuria or hematochezia. He is asymptomatic from the anemia. I will observe for now.  He does not require transfusion now. I will continue reduce treatment as mentioned above.

## 2014-06-18 NOTE — Assessment & Plan Note (Signed)
This is likely due to recent treatment. The patient denies recent history of bleeding such as epistaxis, hematuria or hematochezia. He is asymptomatic from the low platelet count. I will observe for now.  he does not require transfusion now. I will reduce dose as above.

## 2014-06-18 NOTE — Telephone Encounter (Signed)
, °

## 2014-06-19 ENCOUNTER — Ambulatory Visit (HOSPITAL_BASED_OUTPATIENT_CLINIC_OR_DEPARTMENT_OTHER): Payer: BC Managed Care – PPO

## 2014-06-19 ENCOUNTER — Ambulatory Visit: Payer: BC Managed Care – PPO | Admitting: Nutrition

## 2014-06-19 ENCOUNTER — Ambulatory Visit
Admission: RE | Admit: 2014-06-19 | Discharge: 2014-06-19 | Disposition: A | Discharge status: Home / Self Care | Payer: BC Managed Care – PPO | Source: Ambulatory Visit | Attending: Radiation Oncology | Admitting: Radiation Oncology

## 2014-06-19 DIAGNOSIS — C099 Malignant neoplasm of tonsil, unspecified: Secondary | ICD-10-CM

## 2014-06-19 DIAGNOSIS — Z5111 Encounter for antineoplastic chemotherapy: Secondary | ICD-10-CM

## 2014-06-19 DIAGNOSIS — Z510 Encounter for antineoplastic radiation therapy: Secondary | ICD-10-CM | POA: Diagnosis not present

## 2014-06-19 MED ORDER — DEXAMETHASONE SODIUM PHOSPHATE 20 MG/5ML IJ SOLN
INTRAMUSCULAR | Status: AC
  Filled 2014-06-19: qty 5

## 2014-06-19 MED ORDER — SODIUM CHLORIDE 0.9 % IJ SOLN
10.0000 mL | INTRAMUSCULAR | Status: DC | PRN
  Administered 2014-06-19: 10 mL
  Filled 2014-06-19: qty 10

## 2014-06-19 MED ORDER — CISPLATIN CHEMO INJECTION 100MG/100ML
75.0000 mg/m2 | Freq: Once | INTRAVENOUS | Status: AC
  Administered 2014-06-19: 161 mg via INTRAVENOUS
  Filled 2014-06-19: qty 161

## 2014-06-19 MED ORDER — HEPARIN SOD (PORK) LOCK FLUSH 100 UNIT/ML IV SOLN
500.0000 [IU] | Freq: Once | INTRAVENOUS | Status: AC | PRN
  Administered 2014-06-19: 500 [IU]
  Filled 2014-06-19: qty 5

## 2014-06-19 MED ORDER — DEXAMETHASONE SODIUM PHOSPHATE 20 MG/5ML IJ SOLN
12.0000 mg | Freq: Once | INTRAMUSCULAR | Status: AC
  Administered 2014-06-19: 12 mg via INTRAVENOUS

## 2014-06-19 MED ORDER — DEXTROSE-NACL 5-0.45 % IV SOLN
Freq: Once | INTRAVENOUS | Status: AC
Start: 2014-06-19 — End: 2014-06-19
  Administered 2014-06-19: 09:00:00 via INTRAVENOUS
  Filled 2014-06-19: qty 10

## 2014-06-19 MED ORDER — PALONOSETRON HCL INJECTION 0.25 MG/5ML
0.2500 mg | Freq: Once | INTRAVENOUS | Status: AC
  Administered 2014-06-19: 0.25 mg via INTRAVENOUS

## 2014-06-19 MED ORDER — FOSAPREPITANT DIMEGLUMINE INJECTION 150 MG
150.0000 mg | Freq: Once | INTRAVENOUS | Status: AC
  Administered 2014-06-19: 150 mg via INTRAVENOUS
  Filled 2014-06-19: qty 5

## 2014-06-19 NOTE — Progress Notes (Signed)
Nutrition followup completed with patient during chemotherapy. P:atient reports increased fatigue. States he is concentrating on protein intake and has not eat enough calories. Patient endorses weight loss.  Weight was 183.5 pounds December 8 down from 193.6 pounds November 2. Patient denies taste alterations. Reports flushing feeding tube with water on a regular basis.  Estimated nutrition needs: 2400-2600 calories, 110-126 g protein, 2.6 L fluid.  Nutrition diagnosis: Food and nutrition related knowledge deficit continues.  Intervention: Recommended patient add Ensure Plus 4 times a day between meals, either by mouth or via feeding tube. Patient educated on bolus tube feeding administration.   Recommended a 60 cc of free water flush before and after bolus feeds. Educated patient to increase hydration as needed. Patient to continue bowel regimen to avoid constipation. Questions were answered.  Teach back method used.  Monitoring, evaluation, goals: Patient will tolerate adequate calories and protein to minimize any further weight loss. Ensure Plus 4 times a day.  Next visit:December 30, during chemotherapy.  **Disclaimer: This note was dictated with voice recognition software. Similar sounding words can inadvertently be transcribed and this note may contain transcription errors which may not have been corrected upon publication of note.**

## 2014-06-19 NOTE — Patient Instructions (Signed)
[facility] Discharge Instructions for Patients Receiving Chemotherapy  Today you received the following chemotherapy agents Cisplatin.  To help prevent nausea and vomiting after your treatment, we encourage you to take your nausea medication as prescribed.   If you develop nausea and vomiting that is not controlled by your nausea medication, call the clinic.   BELOW ARE SYMPTOMS THAT SHOULD BE REPORTED IMMEDIATELY:  *FEVER GREATER THAN 100.5 F  *CHILLS WITH OR WITHOUT FEVER  NAUSEA AND VOMITING THAT IS NOT CONTROLLED WITH YOUR NAUSEA MEDICATION  *UNUSUAL SHORTNESS OF BREATH  *UNUSUAL BRUISING OR BLEEDING  TENDERNESS IN MOUTH AND THROAT WITH OR WITHOUT PRESENCE OF ULCERS  *URINARY PROBLEMS  *BOWEL PROBLEMS  UNUSUAL RASH Items with * indicate a potential emergency and should be followed up as soon as possible.  Feel free to call the clinic you have any questions or concerns. The clinic phone number is [phone].    

## 2014-06-20 ENCOUNTER — Telehealth: Payer: Self-pay | Admitting: *Deleted

## 2014-06-20 ENCOUNTER — Ambulatory Visit
Admission: RE | Admit: 2014-06-20 | Discharge: 2014-06-20 | Disposition: A | Discharge status: Home / Self Care | Payer: BC Managed Care – PPO | Source: Ambulatory Visit | Attending: Radiation Oncology | Admitting: Radiation Oncology

## 2014-06-20 DIAGNOSIS — Z510 Encounter for antineoplastic radiation therapy: Secondary | ICD-10-CM | POA: Diagnosis not present

## 2014-06-20 NOTE — Telephone Encounter (Signed)
Called patient to inform him of his appt with Garald Balding next Monday @ 3:30 at [facility].  He verbalized understanding.  Gayleen Orem, RN, BSN, [place] at [place] [phone]

## 2014-06-21 ENCOUNTER — Ambulatory Visit
Admission: RE | Admit: 2014-06-21 | Discharge: 2014-06-21 | Disposition: A | Discharge status: Home / Self Care | Payer: BC Managed Care – PPO | Source: Ambulatory Visit | Attending: Radiation Oncology | Admitting: Radiation Oncology

## 2014-06-21 DIAGNOSIS — Z510 Encounter for antineoplastic radiation therapy: Secondary | ICD-10-CM | POA: Diagnosis not present

## 2014-06-24 ENCOUNTER — Telehealth: Payer: Self-pay | Admitting: Hematology and Oncology

## 2014-06-24 ENCOUNTER — Ambulatory Visit
Admission: RE | Admit: 2014-06-24 | Discharge: 2014-06-24 | Disposition: A | Discharge status: Home / Self Care | Payer: BC Managed Care – PPO | Source: Ambulatory Visit | Attending: Radiation Oncology | Admitting: Radiation Oncology

## 2014-06-24 ENCOUNTER — Other Ambulatory Visit: Payer: Self-pay | Admitting: *Deleted

## 2014-06-24 ENCOUNTER — Encounter: Payer: Self-pay | Admitting: Radiation Oncology

## 2014-06-24 ENCOUNTER — Ambulatory Visit: Payer: BC Managed Care – PPO

## 2014-06-24 ENCOUNTER — Ambulatory Visit (HOSPITAL_BASED_OUTPATIENT_CLINIC_OR_DEPARTMENT_OTHER): Payer: BC Managed Care – PPO

## 2014-06-24 ENCOUNTER — Other Ambulatory Visit: Payer: Self-pay | Admitting: Hematology and Oncology

## 2014-06-24 ENCOUNTER — Ambulatory Visit: Payer: Self-pay

## 2014-06-24 VITALS — BP 88/60 | HR 90 | Temp 97.8°F | Resp 12 | Wt 177.7 lb

## 2014-06-24 DIAGNOSIS — R112 Nausea with vomiting, unspecified: Secondary | ICD-10-CM | POA: Diagnosis not present

## 2014-06-24 DIAGNOSIS — R1110 Vomiting, unspecified: Secondary | ICD-10-CM

## 2014-06-24 DIAGNOSIS — R531 Weakness: Secondary | ICD-10-CM | POA: Diagnosis not present

## 2014-06-24 DIAGNOSIS — E860 Dehydration: Secondary | ICD-10-CM | POA: Insufficient documentation

## 2014-06-24 DIAGNOSIS — E46 Unspecified protein-calorie malnutrition: Secondary | ICD-10-CM

## 2014-06-24 DIAGNOSIS — R42 Dizziness and giddiness: Secondary | ICD-10-CM | POA: Insufficient documentation

## 2014-06-24 DIAGNOSIS — C099 Malignant neoplasm of tonsil, unspecified: Secondary | ICD-10-CM

## 2014-06-24 DIAGNOSIS — R5383 Other fatigue: Secondary | ICD-10-CM | POA: Diagnosis not present

## 2014-06-24 DIAGNOSIS — R634 Abnormal weight loss: Secondary | ICD-10-CM

## 2014-06-24 DIAGNOSIS — Z510 Encounter for antineoplastic radiation therapy: Secondary | ICD-10-CM | POA: Diagnosis not present

## 2014-06-24 DIAGNOSIS — K1231 Oral mucositis (ulcerative) due to antineoplastic therapy: Secondary | ICD-10-CM

## 2014-06-24 MED ORDER — SODIUM CHLORIDE 0.9 % IV SOLN
1000.0000 mL | Freq: Once | INTRAVENOUS | Status: AC
  Administered 2014-06-24: 15:00:00 via INTRAVENOUS

## 2014-06-24 MED ORDER — HEPARIN SOD (PORK) LOCK FLUSH 100 UNIT/ML IV SOLN
500.0000 [IU] | Freq: Once | INTRAVENOUS | Status: AC | PRN
  Administered 2014-06-24: 500 [IU]
  Filled 2014-06-24: qty 5

## 2014-06-24 MED ORDER — BIAFINE EX EMUL
Freq: Two times a day (BID) | CUTANEOUS | Status: DC
  Administered 2014-06-24: 16:00:00 via TOPICAL

## 2014-06-24 MED ORDER — SODIUM CHLORIDE 0.9 % IJ SOLN
10.0000 mL | INTRAMUSCULAR | Status: DC | PRN
  Administered 2014-06-24: 10 mL
  Filled 2014-06-24: qty 10

## 2014-06-24 NOTE — Telephone Encounter (Signed)
Added IVF's for today only sent msg to chemo to add the rest of the week. KJ

## 2014-06-24 NOTE — Patient Instructions (Signed)
Dehydration, Adult Dehydration is when you lose more fluids from the body than you take in. Vital organs like the kidneys, brain, and heart cannot function without a proper amount of fluids and salt. Any loss of fluids from the body can cause dehydration.  CAUSES   Vomiting.  Diarrhea.  Excessive sweating.  Excessive urine output.  Fever. SYMPTOMS  Mild dehydration  Thirst.  Dry lips.  Slightly dry mouth. Moderate dehydration  Very dry mouth.  Sunken eyes.  Skin does not bounce back quickly when lightly pinched and released.  Dark urine and decreased urine production.  Decreased tear production.  Headache. Severe dehydration  Very dry mouth.  Extreme thirst.  Rapid, weak pulse (more than 100 beats per minute at rest).  Cold hands and feet.  Not able to sweat in spite of heat and temperature.  Rapid breathing.  Blue lips.  Confusion and lethargy.  Difficulty being awakened.  Minimal urine production.  No tears. DIAGNOSIS  Your caregiver will diagnose dehydration based on your symptoms and your exam. Blood and urine tests will help confirm the diagnosis. The diagnostic evaluation should also identify the cause of dehydration. TREATMENT  Treatment of mild or moderate dehydration can often be done at home by increasing the amount of fluids that you drink. It is best to drink small amounts of fluid more often. Drinking too much at one time can make vomiting worse. Refer to the home care instructions below. Severe dehydration needs to be treated at the hospital where you will probably be given intravenous (IV) fluids that contain water and electrolytes. HOME CARE INSTRUCTIONS   Ask your caregiver about specific rehydration instructions.  Drink enough fluids to keep your urine clear or pale yellow.  Drink small amounts frequently if you have nausea and vomiting.  Eat as you normally do.  Avoid:  Foods or drinks high in sugar.  Carbonated  drinks.  Juice.  Extremely hot or cold fluids.  Drinks with caffeine.  Fatty, greasy foods.  Alcohol.  Tobacco.  Overeating.  Gelatin desserts.  Wash your hands well to avoid spreading bacteria and viruses.  Only take over-the-counter or prescription medicines for pain, discomfort, or fever as directed by your caregiver.  Ask your caregiver if you should continue all prescribed and over-the-counter medicines.  Keep all follow-up appointments with your caregiver. SEEK MEDICAL CARE IF:  You have abdominal pain and it increases or stays in one area (localizes).  You have a rash, stiff neck, or severe headache.  You are irritable, sleepy, or difficult to awaken.  You are weak, dizzy, or extremely thirsty. SEEK IMMEDIATE MEDICAL CARE IF:   You are unable to keep fluids down or you get worse despite treatment.  You have frequent episodes of vomiting or diarrhea.  You have blood or green matter (bile) in your vomit.  You have blood in your stool or your stool looks black and tarry.  You have not urinated in 6 to 8 hours, or you have only urinated a small amount of very dark urine.  You have a fever.  You faint. MAKE SURE YOU:   Understand these instructions.  Will watch your condition.  Will get help right away if you are not doing well or get worse. Document Released: 06/28/2005 Document Revised: 09/20/2011 Document Reviewed: 02/15/2011 ExitCare Patient Information 2015 [employer]. This information is not intended to replace advice given to you by your health care provider. Make sure you discuss any questions you have with your health care   provider.  

## 2014-06-24 NOTE — Progress Notes (Signed)
He rates his pain as mild. Pt complains of, Loss of Sleep, Fatigue and Generalized Weakness.  Pt denies dysphagia. PO Diet: Regular, soft. Oral exam reveals moist mouth and a pink tongue.  Pt reports thick secretions, however they have thinned out. Skin hyperpigmentation, erythema, dry desquamation over neck, lower jaw, and clavicle. Pt continues to apply Biafine.  Pt reports having a bowel movement every other day. He continues to take Miralax. He has lost approx 6 lbs, Orthostatic VS taken.  He reports he is nauseated and has vomited, multiple times within the last week.  He hasn't vomited any in the last 24 hrs.

## 2014-06-24 NOTE — Progress Notes (Signed)
   Weekly Management Note:  Outpatient    ICD-9-CM ICD-10-CM   1. Cancer of Right Tonsil 146.0 C09.9 topical emolient (BIAFINE) emulsion    Current Dose:  46Gy  Projected Dose: 70 Gy   Narrative:  The patient presents for routine under treatment assessment.  CBCT/MVCT images/Port film x-rays were reviewed.  The chart was checked.    He rates his pain as mild. Pt complains of vomiting this weekend.  Nausea better today but he is very lightheaded. Pt denies dysphagia. PO Diet: Regular, soft. Pt continues to apply Biafine.  Pt reports having a bowel movement every other day. He continues to take Miralax. He has lost approx 6 lbs, Orthostatic VS taken.  Chemo was 5 days ago  Physical Findings:  weight is 177 lb 11.2 oz (80.604 kg). His oral temperature is 97.8 F (36.6 C). His blood pressure is 93/69 and his pulse is 80. His respiration is 12 and oxygen saturation is 100%.  no oral thrush or visible mucosal tumor.  Neck - dry skin. Needs to lie down due to symptoms.  CBC    Component Value Date/Time   WBC 3.4* 06/18/2014 1300   WBC 5.7 04/12/2014 1504   RBC 3.13* 06/18/2014 1300   RBC 3.99* 04/12/2014 1504   HGB 10.2* 06/18/2014 1300   HGB 13.2 04/12/2014 1504   HCT 29.2* 06/18/2014 1300   HCT 37.7* 04/12/2014 1504   PLT 117* 06/18/2014 1300   PLT 189 04/12/2014 1504   MCV 93.3 06/18/2014 1300   MCV 94.5 04/12/2014 1504   MCH 32.6 06/18/2014 1300   MCH 33.1 04/12/2014 1504   MCHC 34.9 06/18/2014 1300   MCHC 35.0 04/12/2014 1504   RDW 11.8 06/18/2014 1300   RDW 11.9 04/12/2014 1504   LYMPHSABS 0.2* 06/18/2014 1300   MONOABS 0.4 06/18/2014 1300   EOSABS 0.0 06/18/2014 1300   BASOSABS 0.0 06/18/2014 1300     CMP     Component Value Date/Time   NA 138 06/18/2014 1300   K 4.2 06/18/2014 1300   CO2 28 06/18/2014 1300   GLUCOSE 96 06/18/2014 1300   BUN 19.3 06/18/2014 1300   CREATININE 0.8 06/18/2014 1300   CALCIUM 9.5 06/18/2014 1300   PROT 6.5 06/18/2014 1300   ALBUMIN  3.4* 06/18/2014 1300   AST 14 06/18/2014 1300   ALT 12 06/18/2014 1300   ALKPHOS 74 06/18/2014 1300   BILITOT 0.29 06/18/2014 1300     Impression:  The patient is tolerating radiotherapy. Dehydrated.  Plan:  Continue radiotherapy as planned. Spoke w/ Dr Alvy Bimler - she has standing orders for IV fluids. He will be taken upstairs for this. -----------------------------------  Eppie Gibson, MD

## 2014-06-25 ENCOUNTER — Ambulatory Visit: Payer: Self-pay

## 2014-06-25 ENCOUNTER — Ambulatory Visit
Admission: RE | Admit: 2014-06-25 | Discharge: 2014-06-25 | Disposition: A | Discharge status: Home / Self Care | Payer: BC Managed Care – PPO | Source: Ambulatory Visit | Attending: Radiation Oncology | Admitting: Radiation Oncology

## 2014-06-25 ENCOUNTER — Encounter: Payer: Self-pay | Admitting: *Deleted

## 2014-06-25 DIAGNOSIS — Z510 Encounter for antineoplastic radiation therapy: Secondary | ICD-10-CM | POA: Diagnosis not present

## 2014-06-25 NOTE — Progress Notes (Signed)
Met with patient afterTomo tmt: 1. He adamantly refused to keep IVF appt b/c of yesterday's experience.      He reported that after returning home s/p receiving IVF he experienced N&V.  He was unable to take nutrition or fluids orally.  "I felt worse than when I had chemo".  He feels much better today, has managed oral nutrition.    He is using PEG for Miralax and hydration.    He understands IVF is intended to mitigate dehydration as reflected by Monday's low BP.  I encouraged him to instill as much water via PEG as possible; he verbalized understanding.  He stated he does not plan to keep IVF appt tomorrow; will address situation on Thursday when he sees Dr. Alvy Bimler. 2. I verbally notified Dr. Alvy Bimler of my conversation with patient. 3. Patient signed Information and Consent forms for mentoring program.  He was given copy, additional copy on file.  Gayleen Orem, RN, BSN, [place] at [place] [phone]

## 2014-06-25 NOTE — Progress Notes (Signed)
Duplicate entry

## 2014-06-26 ENCOUNTER — Ambulatory Visit
Admission: RE | Admit: 2014-06-26 | Discharge: 2014-06-26 | Disposition: A | Discharge status: Home / Self Care | Payer: BC Managed Care – PPO | Source: Ambulatory Visit | Attending: Radiation Oncology | Admitting: Radiation Oncology

## 2014-06-26 ENCOUNTER — Ambulatory Visit: Payer: Self-pay

## 2014-06-26 DIAGNOSIS — Z510 Encounter for antineoplastic radiation therapy: Secondary | ICD-10-CM | POA: Diagnosis not present

## 2014-06-27 ENCOUNTER — Ambulatory Visit: Payer: Self-pay | Admitting: Hematology and Oncology

## 2014-06-27 ENCOUNTER — Other Ambulatory Visit: Payer: Self-pay | Admitting: Hematology and Oncology

## 2014-06-27 ENCOUNTER — Ambulatory Visit
Admission: RE | Admit: 2014-06-27 | Discharge: 2014-06-27 | Disposition: A | Discharge status: Home / Self Care | Payer: BC Managed Care – PPO | Source: Ambulatory Visit | Attending: Radiation Oncology | Admitting: Radiation Oncology

## 2014-06-27 ENCOUNTER — Ambulatory Visit: Payer: Self-pay

## 2014-06-27 DIAGNOSIS — Z510 Encounter for antineoplastic radiation therapy: Secondary | ICD-10-CM | POA: Diagnosis not present

## 2014-06-27 NOTE — Addendum Note (Signed)
Encounter addended by: Jenene Slicker, RN on: 06/27/2014 11:48 AM<BR>     Documentation filed: Vitals Section

## 2014-06-28 ENCOUNTER — Ambulatory Visit
Admission: RE | Admit: 2014-06-28 | Discharge: 2014-06-28 | Disposition: A | Discharge status: Home / Self Care | Payer: BC Managed Care – PPO | Source: Ambulatory Visit | Attending: Radiation Oncology | Admitting: Radiation Oncology

## 2014-06-28 ENCOUNTER — Encounter: Payer: Self-pay | Admitting: *Deleted

## 2014-06-28 ENCOUNTER — Ambulatory Visit: Payer: Self-pay

## 2014-06-28 DIAGNOSIS — Z510 Encounter for antineoplastic radiation therapy: Secondary | ICD-10-CM | POA: Diagnosis not present

## 2014-06-28 NOTE — Progress Notes (Addendum)
To provide support and encouragement, care continuity and to assess for needs, met with patient and his dtr Gae Bon prior to daily Tomo tmt.   1. He reported:  He is maintaining hydration, orally and via PEG.  Instilling 2 nutritional supplements daily via PEG.  No episodes of dizziness.  Good stamina, taking long walks outdoors. 2. We discussed:  The importance of maintaining hydration and nutrition.   Goal of minimal weight loss during upcoming weeks.  The expectation that RT SEs might increase during the first couple of weeks following final RT. He verbalized understanding of discussion points.  Gayleen Orem, RN, BSN, [place] at [place] [phone]

## 2014-07-01 ENCOUNTER — Ambulatory Visit
Admission: RE | Admit: 2014-07-01 | Discharge: 2014-07-01 | Disposition: A | Discharge status: Home / Self Care | Payer: BC Managed Care – PPO | Source: Ambulatory Visit | Attending: Radiation Oncology | Admitting: Radiation Oncology

## 2014-07-01 ENCOUNTER — Encounter: Payer: Self-pay | Admitting: Radiation Oncology

## 2014-07-01 ENCOUNTER — Other Ambulatory Visit: Payer: Self-pay | Admitting: Hematology and Oncology

## 2014-07-01 VITALS — BP 87/60 | HR 95 | Temp 98.0°F | Ht 74.0 in | Wt 177.2 lb

## 2014-07-01 DIAGNOSIS — C099 Malignant neoplasm of tonsil, unspecified: Secondary | ICD-10-CM | POA: Insufficient documentation

## 2014-07-01 DIAGNOSIS — Z510 Encounter for antineoplastic radiation therapy: Secondary | ICD-10-CM | POA: Diagnosis not present

## 2014-07-01 MED ORDER — BIAFINE EX EMUL
Freq: Two times a day (BID) | CUTANEOUS | Status: DC
  Administered 2014-07-01: 14:00:00 via TOPICAL

## 2014-07-01 NOTE — Progress Notes (Signed)
   Weekly Management Note:  Outpatient    ICD-9-CM ICD-10-CM   1. Cancer of Right Tonsil 146.0 C09.9     Current Dose:  56 Gy  Projected Dose: 70 Gy   Narrative:  The patient presents for routine under treatment assessment.  CBCT/MVCT images/Port film x-rays were reviewed.  The chart was checked. limited intake of fluids, nutritional shakes.  Needs refills soon for oxycodone and Ambien  Physical Findings Vitals with Age-Percentiles 07/01/2014 07/01/2014  Length  697 cm  Systolic 87 948  Diastolic 60 65  Pulse 95 84  Respiration    Weight  80.377 kg  BMI  22.8  VISIT REPORT       :  Dry peeling skin over neck, no palpable nodes. confluent mucositis in oropharynx. No thrush  CBC    Component Value Date/Time   WBC 3.4* 06/18/2014 1300   WBC 5.7 04/12/2014 1504   RBC 3.13* 06/18/2014 1300   RBC 3.99* 04/12/2014 1504   HGB 10.2* 06/18/2014 1300   HGB 13.2 04/12/2014 1504   HCT 29.2* 06/18/2014 1300   HCT 37.7* 04/12/2014 1504   PLT 117* 06/18/2014 1300   PLT 189 04/12/2014 1504   MCV 93.3 06/18/2014 1300   MCV 94.5 04/12/2014 1504   MCH 32.6 06/18/2014 1300   MCH 33.1 04/12/2014 1504   MCHC 34.9 06/18/2014 1300   MCHC 35.0 04/12/2014 1504   RDW 11.8 06/18/2014 1300   RDW 11.9 04/12/2014 1504   LYMPHSABS 0.2* 06/18/2014 1300   MONOABS 0.4 06/18/2014 1300   EOSABS 0.0 06/18/2014 1300   BASOSABS 0.0 06/18/2014 1300     CMP     Component Value Date/Time   NA 138 06/18/2014 1300   K 4.2 06/18/2014 1300   CO2 28 06/18/2014 1300   GLUCOSE 96 06/18/2014 1300   BUN 19.3 06/18/2014 1300   CREATININE 0.8 06/18/2014 1300   CALCIUM 9.5 06/18/2014 1300   PROT 6.5 06/18/2014 1300   ALBUMIN 3.4* 06/18/2014 1300   AST 14 06/18/2014 1300   ALT 12 06/18/2014 1300   ALKPHOS 74 06/18/2014 1300   BILITOT 0.29 06/18/2014 1300     Impression:  The patient is tolerating radiotherapy.   Plan:  Continue radiotherapy as planned.  I will ask med/onc to refill his  oxycodone  and Ambien tomorrow  Vitals, history imply he may need IVF. Pt needs to take daughter to airport but agrees to stat BMP and possible IV fluids tomorrow.  Will go to nursing after labs and RT.  Biafine over neck 2-3 times daily -----------------------------------  Eppie Gibson, MD

## 2014-07-01 NOTE — Progress Notes (Signed)
[name] has received 28 fractions to his right tonsil.  He grades his thraot pain as a level 7/10.  Note mucositis in his throat. He is eating soft foods and instills only 3 cans of ensure or boost daily via his PEG tube.  Encouraged to add 2 more cans daily and he stated he would.  Denies any nausea nor diarrhea with the enteral nutrition at this time.  Skin on neck with redness with a dry peel on the right lateral neck.  Using Biafine and given additional tube today.

## 2014-07-02 ENCOUNTER — Telehealth: Payer: Self-pay | Admitting: *Deleted

## 2014-07-02 ENCOUNTER — Ambulatory Visit
Admission: RE | Admit: 2014-07-02 | Discharge: 2014-07-02 | Disposition: A | Discharge status: Home / Self Care | Payer: BC Managed Care – PPO | Source: Ambulatory Visit | Attending: Radiation Oncology | Admitting: Radiation Oncology

## 2014-07-02 ENCOUNTER — Encounter: Payer: Self-pay | Admitting: *Deleted

## 2014-07-02 ENCOUNTER — Other Ambulatory Visit: Payer: Self-pay | Admitting: Hematology and Oncology

## 2014-07-02 ENCOUNTER — Other Ambulatory Visit: Payer: Self-pay | Admitting: *Deleted

## 2014-07-02 DIAGNOSIS — C099 Malignant neoplasm of tonsil, unspecified: Secondary | ICD-10-CM

## 2014-07-02 DIAGNOSIS — G4700 Insomnia, unspecified: Secondary | ICD-10-CM

## 2014-07-02 DIAGNOSIS — Z510 Encounter for antineoplastic radiation therapy: Secondary | ICD-10-CM | POA: Diagnosis not present

## 2014-07-02 LAB — BASIC METABOLIC PANEL (CC13)
ANION GAP: 9 meq/L (ref 3–11)
BUN: 18.8 mg/dL (ref 7.0–26.0)
CALCIUM: 9.3 mg/dL (ref 8.4–10.4)
CO2: 30 mEq/L — ABNORMAL HIGH (ref 22–29)
Chloride: 95 mEq/L — ABNORMAL LOW (ref 98–109)
Creatinine: 0.8 mg/dL (ref 0.7–1.3)
Glucose: 98 mg/dl (ref 70–140)
Potassium: 3.9 mEq/L (ref 3.5–5.1)
SODIUM: 134 meq/L — AB (ref 136–145)

## 2014-07-02 MED ORDER — ZOLPIDEM TARTRATE 10 MG PO TABS
10.0000 mg | ORAL_TABLET | Freq: Every evening | ORAL | Status: DC | PRN
Start: 2014-07-02 — End: 2014-11-06

## 2014-07-02 MED ORDER — OXYCODONE HCL 15 MG PO TABS: 15.0000 mg | ORAL_TABLET | Freq: Four times a day (QID) | ORAL | Status: DC | PRN

## 2014-07-02 NOTE — Telephone Encounter (Signed)
Called and left message for [name] to inform him that he has been scheduled for IV Hydration on tomorrow at 1:45pm, directly after his treatment.  Explained that the goal is to ensure that he remains hydrated throughout the holiday.  Requested call back.

## 2014-07-02 NOTE — Telephone Encounter (Signed)
CALLED PATIENT TO INFORM TO COME IN EARLY TODAY FOR LAB, LVM FOR A RETURN CALL

## 2014-07-02 NOTE — Progress Notes (Signed)
Met with patient after Tomo tmt: 1. Escorted him to [place] for dehydration eval.  VS not hypostatic. 2. Dr. Isidore Moos aware of labs, acceptable to her.  She indicated IVF not needed today as long as he continues to push fluids. 3. RN Malachy Mood indicated she would arrange for IVF for tomorrow shd VS suggested needed. 4. He stated:  He is able to eat soft foods, drink, without difficulty/discomfort.  Throat pain is about 5/10 which he indicated is tolerable, I encouraged him to use Carafate and Oxycodone as needed.  Having BMs every other day, he is taking Miralax daily except lately b/c stools very soft. 5. I counseled pt to increase his hydration as much as possible.  He stated he will do so. 6. I counseled pt to keep upcoming appt with Nutrition.  He stated he would. 7. I provided Rx written by Dr. Alvy Bimler for Ambien and Oxycodone 15 mg. 8. Patient indicated he will see Dr. Alvy Bimler on 12/30 @ 12:45 per her offering.  I will confirm with Audie Clear. Patient understands he can contact me with concerns/needs.  Gayleen Orem, RN, BSN, [place] at [place] [phone]

## 2014-07-02 NOTE — Telephone Encounter (Signed)
[name] confirmed that he would like to receive IV fluids following treatment tomorrow.

## 2014-07-03 ENCOUNTER — Other Ambulatory Visit: Payer: Self-pay | Admitting: *Deleted

## 2014-07-03 ENCOUNTER — Encounter: Payer: Self-pay | Admitting: *Deleted

## 2014-07-03 ENCOUNTER — Ambulatory Visit
Admission: RE | Admit: 2014-07-03 | Discharge: 2014-07-03 | Disposition: A | Discharge status: Home / Self Care | Payer: BC Managed Care – PPO | Source: Ambulatory Visit | Attending: Radiation Oncology | Admitting: Radiation Oncology

## 2014-07-03 ENCOUNTER — Ambulatory Visit (HOSPITAL_BASED_OUTPATIENT_CLINIC_OR_DEPARTMENT_OTHER): Payer: BC Managed Care – PPO

## 2014-07-03 VITALS — BP 95/65 | HR 88 | Temp 98.3°F | Resp 18

## 2014-07-03 DIAGNOSIS — C099 Malignant neoplasm of tonsil, unspecified: Secondary | ICD-10-CM

## 2014-07-03 DIAGNOSIS — Z510 Encounter for antineoplastic radiation therapy: Secondary | ICD-10-CM | POA: Diagnosis not present

## 2014-07-03 MED ORDER — SODIUM CHLORIDE 0.9 % IV SOLN
Freq: Once | INTRAVENOUS | Status: AC
  Filled 2014-07-03: qty 1000

## 2014-07-03 MED ORDER — SODIUM CHLORIDE 0.9 % IV SOLN
Freq: Once | INTRAVENOUS | Status: AC
  Administered 2014-07-03: 14:00:00 via INTRAVENOUS

## 2014-07-03 NOTE — Patient Instructions (Signed)
Dehydration, Adult Dehydration is when you lose more fluids from the body than you take in. Vital organs like the kidneys, brain, and heart cannot function without a proper amount of fluids and salt. Any loss of fluids from the body can cause dehydration.  CAUSES   Vomiting.  Diarrhea.  Excessive sweating.  Excessive urine output.  Fever. SYMPTOMS  Mild dehydration  Thirst.  Dry lips.  Slightly dry mouth. Moderate dehydration  Very dry mouth.  Sunken eyes.  Skin does not bounce back quickly when lightly pinched and released.  Dark urine and decreased urine production.  Decreased tear production.  Headache. Severe dehydration  Very dry mouth.  Extreme thirst.  Rapid, weak pulse (more than 100 beats per minute at rest).  Cold hands and feet.  Not able to sweat in spite of heat and temperature.  Rapid breathing.  Blue lips.  Confusion and lethargy.  Difficulty being awakened.  Minimal urine production.  No tears. DIAGNOSIS  Your caregiver will diagnose dehydration based on your symptoms and your exam. Blood and urine tests will help confirm the diagnosis. The diagnostic evaluation should also identify the cause of dehydration. TREATMENT  Treatment of mild or moderate dehydration can often be done at home by increasing the amount of fluids that you drink. It is best to drink small amounts of fluid more often. Drinking too much at one time can make vomiting worse. Refer to the home care instructions below. Severe dehydration needs to be treated at the hospital where you will probably be given intravenous (IV) fluids that contain water and electrolytes. HOME CARE INSTRUCTIONS   Ask your caregiver about specific rehydration instructions.  Drink enough fluids to keep your urine clear or pale yellow.  Drink small amounts frequently if you have nausea and vomiting.  Eat as you normally do.  Avoid:  Foods or drinks high in sugar.  Carbonated  drinks.  Juice.  Extremely hot or cold fluids.  Drinks with caffeine.  Fatty, greasy foods.  Alcohol.  Tobacco.  Overeating.  Gelatin desserts.  Wash your hands well to avoid spreading bacteria and viruses.  Only take over-the-counter or prescription medicines for pain, discomfort, or fever as directed by your caregiver.  Ask your caregiver if you should continue all prescribed and over-the-counter medicines.  Keep all follow-up appointments with your caregiver. SEEK MEDICAL CARE IF:  You have abdominal pain and it increases or stays in one area (localizes).  You have a rash, stiff neck, or severe headache.  You are irritable, sleepy, or difficult to awaken.  You are weak, dizzy, or extremely thirsty. SEEK IMMEDIATE MEDICAL CARE IF:   You are unable to keep fluids down or you get worse despite treatment.  You have frequent episodes of vomiting or diarrhea.  You have blood or green matter (bile) in your vomit.  You have blood in your stool or your stool looks black and tarry.  You have not urinated in 6 to 8 hours, or you have only urinated a small amount of very dark urine.  You have a fever.  You faint. MAKE SURE YOU:   Understand these instructions.  Will watch your condition.  Will get help right away if you are not doing well or get worse. Document Released: 06/28/2005 Document Revised: 09/20/2011 Document Reviewed: 02/15/2011 ExitCare Patient Information 2015 [employer]. This information is not intended to replace advice given to you by your health care provider. Make sure you discuss any questions you have with your health care   provider.  

## 2014-07-03 NOTE — Progress Notes (Signed)
Met patient after Tomo tmt, escorted to MedOnc Registration and then to Infusion for scheduled IVF.  Provided report to RN Lorriane Shire, addressed concerns about dehydration, requested orthostatic VS.  I indicated I was going to arrange for IVF for tomorrow morning if warranted; scheduled 0815 appt.  Lorriane Shire conferred with me shortly afterwards and confirmed that her assessment indicated additional IVF appropriate.  I relayed order request to RN Patric Dykes.  Patient aware of appt.  Gayleen Orem, RN, BSN, [place] at [place] [phone]

## 2014-07-04 ENCOUNTER — Ambulatory Visit
Admission: RE | Admit: 2014-07-04 | Discharge: 2014-07-04 | Disposition: A | Discharge status: Home / Self Care | Payer: BC Managed Care – PPO | Source: Ambulatory Visit | Attending: Radiation Oncology | Admitting: Radiation Oncology

## 2014-07-04 ENCOUNTER — Ambulatory Visit (HOSPITAL_BASED_OUTPATIENT_CLINIC_OR_DEPARTMENT_OTHER): Payer: BC Managed Care – PPO

## 2014-07-04 ENCOUNTER — Telehealth: Payer: Self-pay | Admitting: *Deleted

## 2014-07-04 ENCOUNTER — Encounter: Payer: Self-pay | Admitting: *Deleted

## 2014-07-04 VITALS — BP 124/64 | HR 71 | Temp 98.2°F | Resp 18

## 2014-07-04 DIAGNOSIS — C099 Malignant neoplasm of tonsil, unspecified: Secondary | ICD-10-CM

## 2014-07-04 DIAGNOSIS — Z510 Encounter for antineoplastic radiation therapy: Secondary | ICD-10-CM | POA: Diagnosis not present

## 2014-07-04 MED ORDER — SODIUM CHLORIDE 0.9 % IV SOLN
Freq: Once | INTRAVENOUS | Status: AC
  Administered 2014-07-04: 09:00:00 via INTRAVENOUS

## 2014-07-04 MED ORDER — HEPARIN SOD (PORK) LOCK FLUSH 100 UNIT/ML IV SOLN
500.0000 [IU] | Freq: Once | INTRAVENOUS | Status: AC
  Administered 2014-07-04: 500 [IU] via INTRAVENOUS
  Filled 2014-07-04: qty 5

## 2014-07-04 MED ORDER — SODIUM CHLORIDE 0.9 % IJ SOLN
10.0000 mL | INTRAMUSCULAR | Status: DC | PRN
  Administered 2014-07-04: 10 mL via INTRAVENOUS
  Filled 2014-07-04: qty 10

## 2014-07-04 NOTE — Telephone Encounter (Signed)
Called pt, LVM, indicating he has an 8:00 appt on Saturday for IVF.  I directed him to call [phone] to confirm.  I encouraged him to keep appt.  Gayleen Orem, RN, BSN, [place] at [place] [phone]

## 2014-07-04 NOTE — Patient Instructions (Signed)
Dehydration, Adult Dehydration is when you lose more fluids from the body than you take in. Vital organs like the kidneys, brain, and heart cannot function without a proper amount of fluids and salt. Any loss of fluids from the body can cause dehydration.  CAUSES   Vomiting.  Diarrhea.  Excessive sweating.  Excessive urine output.  Fever. SYMPTOMS  Mild dehydration  Thirst.  Dry lips.  Slightly dry mouth. Moderate dehydration  Very dry mouth.  Sunken eyes.  Skin does not bounce back quickly when lightly pinched and released.  Dark urine and decreased urine production.  Decreased tear production.  Headache. Severe dehydration  Very dry mouth.  Extreme thirst.  Rapid, weak pulse (more than 100 beats per minute at rest).  Cold hands and feet.  Not able to sweat in spite of heat and temperature.  Rapid breathing.  Blue lips.  Confusion and lethargy.  Difficulty being awakened.  Minimal urine production.  No tears. DIAGNOSIS  Your caregiver will diagnose dehydration based on your symptoms and your exam. Blood and urine tests will help confirm the diagnosis. The diagnostic evaluation should also identify the cause of dehydration. TREATMENT  Treatment of mild or moderate dehydration can often be done at home by increasing the amount of fluids that you drink. It is best to drink small amounts of fluid more often. Drinking too much at one time can make vomiting worse. Refer to the home care instructions below. Severe dehydration needs to be treated at the hospital where you will probably be given intravenous (IV) fluids that contain water and electrolytes. HOME CARE INSTRUCTIONS   Ask your caregiver about specific rehydration instructions.  Drink enough fluids to keep your urine clear or pale yellow.  Drink small amounts frequently if you have nausea and vomiting.  Eat as you normally do.  Avoid:  Foods or drinks high in sugar.  Carbonated  drinks.  Juice.  Extremely hot or cold fluids.  Drinks with caffeine.  Fatty, greasy foods.  Alcohol.  Tobacco.  Overeating.  Gelatin desserts.  Wash your hands well to avoid spreading bacteria and viruses.  Only take over-the-counter or prescription medicines for pain, discomfort, or fever as directed by your caregiver.  Ask your caregiver if you should continue all prescribed and over-the-counter medicines.  Keep all follow-up appointments with your caregiver. SEEK MEDICAL CARE IF:  You have abdominal pain and it increases or stays in one area (localizes).  You have a rash, stiff neck, or severe headache.  You are irritable, sleepy, or difficult to awaken.  You are weak, dizzy, or extremely thirsty. SEEK IMMEDIATE MEDICAL CARE IF:   You are unable to keep fluids down or you get worse despite treatment.  You have frequent episodes of vomiting or diarrhea.  You have blood or green matter (bile) in your vomit.  You have blood in your stool or your stool looks black and tarry.  You have not urinated in 6 to 8 hours, or you have only urinated a small amount of very dark urine.  You have a fever.  You faint. MAKE SURE YOU:   Understand these instructions.  Will watch your condition.  Will get help right away if you are not doing well or get worse. Document Released: 06/28/2005 Document Revised: 09/20/2011 Document Reviewed: 02/15/2011 ExitCare Patient Information 2015 [employer]. This information is not intended to replace advice given to you by your health care provider. Make sure you discuss any questions you have with your health care   provider.  

## 2014-07-04 NOTE — Progress Notes (Signed)
To provide support and encouragement, care continuity and to assess for needs, met with patient during IVF this morning.  He reported "I am feeling much better" after yesterday afternoon's IVF, "good thing to do".  He asked if he should have IVF next week, we discussed criteria that would suggest fluids would be appropriate.  I indicated I would inform Dr. Isidore Moos of his interest in additional fluids.  Gayleen Orem, RN, BSN, [place] at [place] [phone]

## 2014-07-05 ENCOUNTER — Ambulatory Visit: Payer: BC Managed Care – PPO

## 2014-07-06 ENCOUNTER — Ambulatory Visit (HOSPITAL_BASED_OUTPATIENT_CLINIC_OR_DEPARTMENT_OTHER): Payer: BC Managed Care – PPO

## 2014-07-06 VITALS — BP 121/62 | HR 81 | Temp 98.2°F | Resp 16

## 2014-07-06 DIAGNOSIS — C099 Malignant neoplasm of tonsil, unspecified: Secondary | ICD-10-CM

## 2014-07-06 MED ORDER — SODIUM CHLORIDE 0.9 % IV SOLN
1000.0000 mL | INTRAVENOUS | Status: AC
  Administered 2014-07-06: 08:00:00 via INTRAVENOUS

## 2014-07-06 MED ORDER — SODIUM CHLORIDE 0.9 % IJ SOLN
10.0000 mL | INTRAMUSCULAR | Status: DC | PRN
  Administered 2014-07-06: 10 mL via INTRAVENOUS
  Filled 2014-07-06: qty 10

## 2014-07-06 MED ORDER — HEPARIN SOD (PORK) LOCK FLUSH 100 UNIT/ML IV SOLN
500.0000 [IU] | Freq: Once | INTRAVENOUS | Status: AC
  Administered 2014-07-06: 500 [IU] via INTRAVENOUS
  Filled 2014-07-06: qty 5

## 2014-07-06 NOTE — Patient Instructions (Signed)

## 2014-07-08 ENCOUNTER — Ambulatory Visit: Payer: BC Managed Care – PPO | Attending: Radiation Oncology | Admitting: Radiation Oncology

## 2014-07-08 ENCOUNTER — Encounter: Payer: Self-pay | Admitting: Radiation Oncology

## 2014-07-08 ENCOUNTER — Encounter: Payer: Self-pay | Admitting: *Deleted

## 2014-07-08 ENCOUNTER — Ambulatory Visit
Admission: RE | Admit: 2014-07-08 | Discharge: 2014-07-08 | Disposition: A | Discharge status: Home / Self Care | Payer: BC Managed Care – PPO | Source: Ambulatory Visit | Attending: Radiation Oncology | Admitting: Radiation Oncology

## 2014-07-08 VITALS — BP 111/62 | HR 84 | Temp 98.0°F | Resp 12 | Wt 175.0 lb

## 2014-07-08 DIAGNOSIS — C099 Malignant neoplasm of tonsil, unspecified: Secondary | ICD-10-CM | POA: Diagnosis not present

## 2014-07-08 DIAGNOSIS — Z510 Encounter for antineoplastic radiation therapy: Secondary | ICD-10-CM | POA: Diagnosis not present

## 2014-07-08 DIAGNOSIS — Z931 Gastrostomy status: Secondary | ICD-10-CM | POA: Insufficient documentation

## 2014-07-08 DIAGNOSIS — K1230 Oral mucositis (ulcerative), unspecified: Secondary | ICD-10-CM | POA: Diagnosis not present

## 2014-07-08 MED ORDER — BIAFINE EX EMUL
Freq: Two times a day (BID) | CUTANEOUS | Status: DC
  Administered 2014-07-08: 16:00:00 via TOPICAL

## 2014-07-08 NOTE — Progress Notes (Signed)
To provide support and encouragement, care continuity and to assess for needs, met with patient during weekly UT with Dr. Valere Dross.  Patient reported: 1. Kept appt for IVF this past Saturday, stated he "felt much better" after completion. 2. Using tube for fluids and Miralax. 3. Understanding of 1:00 appt with Dr. Alvy Bimler this Wednesday. I reinforced Dr. Charlton Amor guidance to: 1. Drink chicken broth to increase sodium intake.  Patient verbalized understanding how this will improve his hydration. 2. Increase PRN pain medication so he can increase oral intake. Patient understands he can call me if needs/concerns arise.  Gayleen Orem, RN, BSN, [place] at [place] [phone]

## 2014-07-08 NOTE — Progress Notes (Signed)
Weekly Management Note:  Site: Right tonsil/neck Current Dose:  6400  cGy Projected Dose: 7000  cGy  Narrative: The patient is seen today for routine under treatment assessment. CBCT/MVCT images/port films were reviewed. The chart was reviewed.   He continues to have pain on swallowing and is getting all his nutrition through his PEG tube.  He had IV fluids last week and also this past Saturday.  He feels much better after IV fluids.  7 on a scale of 0-10.  He takes only one oxycodone tablets a day.  He is on MiraLAX to prevent constipation.  His last chemotherapy was approximately 3 weeks ago.  His weight is down 2 pounds over the past week.  Physical Examination:  Filed Vitals:   07/08/14 1347  BP: 111/62  Pulse: 84  Temp:   Resp:   .  Weight: 175 lb (79.379 kg).  On inspection the oropharynx there is a confluent mucositis right greater than left.  No candidiasis.  There is no palpable adenopathy in the neck.  Impression: Tolerating radiation therapy well.  He may be slightly dehydrated.  I instructed him to maintain his nutritional supplementation and also fluid intake.  He may take chicken broth by mouth to increase his salt intake.  Plan: Continue radiation therapy as planned.  I will see him this Thursday after his final treatment.

## 2014-07-08 NOTE — Progress Notes (Signed)
He rates his pain as a 7 on a scale of 0-10, Pain -Stabbing and Throbbing and Pain Occurs  Constantly. Pt complains of a sore throat.  Pt has had dysphagia for both solids and liquids. Only receiving nutrition via Gtube- 5 cans Boost a day with a quart of h20. Oral exam reveals mucous membranes moist, pharynx normal without lesions, noted white patches on soft and hard palette. Skin erythema and dryness over neck and clavical. Pt continues to apply Biafine.   Wt Readings from Last 3 Encounters:  07/08/14 175 lb (79.379 kg)  07/01/14 177 lb 3.2 oz (80.377 kg)  06/24/14 177 lb 11.2 oz (80.604 kg)   Pt reports continued use of Miralax every other day, with reported bowel movements every other day.

## 2014-07-08 NOTE — Addendum Note (Signed)
Encounter addended by: Jenene Slicker, RN on: 07/08/2014  4:18 PM<BR>     Documentation filed: Orders, Dx Association, Inpatient [facility]

## 2014-07-09 ENCOUNTER — Ambulatory Visit
Admission: RE | Admit: 2014-07-09 | Discharge: 2014-07-09 | Disposition: A | Discharge status: Home / Self Care | Payer: BC Managed Care – PPO | Source: Ambulatory Visit | Attending: Radiation Oncology | Admitting: Radiation Oncology

## 2014-07-09 DIAGNOSIS — Z510 Encounter for antineoplastic radiation therapy: Secondary | ICD-10-CM | POA: Diagnosis not present

## 2014-07-10 ENCOUNTER — Encounter: Payer: BC Managed Care – PPO | Admitting: Nutrition

## 2014-07-10 ENCOUNTER — Encounter: Payer: Self-pay | Admitting: *Deleted

## 2014-07-10 ENCOUNTER — Telehealth: Payer: Self-pay | Admitting: Hematology and Oncology

## 2014-07-10 ENCOUNTER — Encounter: Payer: Self-pay | Admitting: Hematology and Oncology

## 2014-07-10 ENCOUNTER — Ambulatory Visit (HOSPITAL_BASED_OUTPATIENT_CLINIC_OR_DEPARTMENT_OTHER): Payer: BC Managed Care – PPO | Admitting: Hematology and Oncology

## 2014-07-10 ENCOUNTER — Ambulatory Visit
Admission: RE | Admit: 2014-07-10 | Discharge: 2014-07-10 | Disposition: A | Discharge status: Home / Self Care | Payer: BC Managed Care – PPO | Source: Ambulatory Visit | Attending: Radiation Oncology | Admitting: Radiation Oncology

## 2014-07-10 ENCOUNTER — Ambulatory Visit: Payer: Self-pay

## 2014-07-10 VITALS — BP 119/53 | HR 78 | Temp 98.2°F | Resp 19 | Ht 74.0 in | Wt 175.0 lb

## 2014-07-10 DIAGNOSIS — Z931 Gastrostomy status: Secondary | ICD-10-CM

## 2014-07-10 DIAGNOSIS — E46 Unspecified protein-calorie malnutrition: Secondary | ICD-10-CM

## 2014-07-10 DIAGNOSIS — R112 Nausea with vomiting, unspecified: Secondary | ICD-10-CM

## 2014-07-10 DIAGNOSIS — K1231 Oral mucositis (ulcerative) due to antineoplastic therapy: Secondary | ICD-10-CM

## 2014-07-10 DIAGNOSIS — Z09 Encounter for follow-up examination after completed treatment for conditions other than malignant neoplasm: Secondary | ICD-10-CM

## 2014-07-10 DIAGNOSIS — Z510 Encounter for antineoplastic radiation therapy: Secondary | ICD-10-CM | POA: Diagnosis not present

## 2014-07-10 DIAGNOSIS — D630 Anemia in neoplastic disease: Secondary | ICD-10-CM

## 2014-07-10 DIAGNOSIS — C099 Malignant neoplasm of tonsil, unspecified: Secondary | ICD-10-CM

## 2014-07-10 MED ORDER — OXYCODONE HCL 15 MG PO TABS: 15.0000 mg | ORAL_TABLET | Freq: Four times a day (QID) | ORAL | Status: DC | PRN

## 2014-07-10 NOTE — Telephone Encounter (Signed)
gv and printed appt sched and avs for pt for Jan 2016 °

## 2014-07-10 NOTE — Assessment & Plan Note (Signed)
The patient was noncompliant and has reschedule his recent appointment and did not show up for some of his appointment. I reinforced the importance of close follow-up. Clinically, he is doing well with his treatment. He will complete his radiation therapy tomorrow. I will see him back in 2 weeks for further supportive care.

## 2014-07-10 NOTE — Assessment & Plan Note (Signed)
He has recent weight loss due to altered taste sensation and reduced appetite. I recommend he start increase tube feedings from 4 cans per day to 8 cans if tolerated and I will get nutritionists to follow him up closely.

## 2014-07-10 NOTE — Assessment & Plan Note (Signed)
The feeding tube appears to be functioning well.

## 2014-07-10 NOTE — Assessment & Plan Note (Signed)
This is related to side effects of treatment. We'll continue current prescription pain medicine for pain control. I refilled his prescription and reinforced narcotic refill policy

## 2014-07-10 NOTE — Assessment & Plan Note (Signed)
This is likely due to recent treatment. The patient denies recent history of bleeding such as epistaxis, hematuria or hematochezia. He is asymptomatic from the anemia. I will observe for now.    

## 2014-07-10 NOTE — Assessment & Plan Note (Signed)
He will continue antiemetics as needed. At present time, he felt that his nausea is well controlled and he declined further IV fluids therapy

## 2014-07-10 NOTE — Progress Notes (Signed)
[name] OFFICE PROGRESS NOTE  Patient Care Team: Pcp Not In System as PCP - General Brooks Sailors, RN as Oncology Nurse Navigator Eppie Gibson, MD as Attending Physician (Radiation Oncology) Karie Mainland, RD as Dietitian (Nutrition) Heath Lark, MD as Consulting Physician (Hematology and Oncology)  SUMMARY OF ONCOLOGIC HISTORY: Oncology History   Cancer of Right Tonsil, HPV positive   Staging form: Pharynx - Oropharynx, AJCC 7th Edition     Clinical stage from 05/21/2014: Stage IVA (T2, N2, M0) - Signed by Heath Lark, MD on 05/21/2014        Cancer of Right Tonsil   03/15/2014 Imaging Ct scan of neck showed bulky adenopathy in the right neck, likely metastatic squamous cell carcinoma.   03/20/2014 Procedure Accession: [id number] FNA of right neck lymph node is non-diagnostic   03/29/2014 Imaging PET/CT showed mild uptake bilateral tonsil and extensive right cervical chain lymphadenopathy with malignant range hypermetabolism   04/19/2014 Surgery Direct laryngoscopy was normal and tonsillectomy was performed   04/19/2014 Pathology Results Accession: [id number] right tonsil showed squamous cell carcinoma, P16 positive   05/21/2014 Procedure the patient has placement of port and feeding tube.   05/22/2014 -  Chemotherapy He received high-dose cisplatin.   05/22/2014 -  Radiation Therapy He received radiation treatment.   06/11/2014 Adverse Reaction Cycle 2 of chemotherapy is delayed due to leukopenia. Dose will be adjusted due to hearing loss/tinnitus as well.    INTERVAL HISTORY: Please see below for problem oriented charting. He returns for further follow-up. The patient did not show up for multiple appointments recently. He has started using his feeding tube recently for nutritional intake but only using it 4 times a day. He received IV fluid last week with improvement of his symptoms. He denies further nausea or vomiting recently and he is using antiemetics as  prescribed. The mucositis pain is currently under control with current pain prescription.  REVIEW OF SYSTEMS:   Constitutional: Denies fevers, chills  Eyes: Denies blurriness of vision Respiratory: Denies cough, dyspnea or wheezes Cardiovascular: Denies palpitation, chest discomfort or lower extremity swelling Skin: Denies abnormal skin rashes Lymphatics: Denies new lymphadenopathy or easy bruising Neurological:Denies numbness, tingling or new weaknesses Behavioral/Psych: Mood is stable, no new changes  All other systems were reviewed with the patient and are negative.  I have reviewed the past medical history, past surgical history, social history and family history with the patient and they are unchanged from previous note.  ALLERGIES:  has No Known Allergies.  MEDICATIONS:  Current Outpatient Prescriptions  Medication Sig Dispense Refill  . emollient (BIAFINE) cream Apply 1 application topically 2 (two) times daily. Can apply BID or TID    . lidocaine (XYLOCAINE) 2 % solution Patient: Mix 1part 2% viscous lidocaine, 1part H20. Swish and/or swallow 37mL of this mixture, 57min before meals and at bedtime, up to QID (Patient not taking: Reported on 06/18/2014) 100 mL 5  . oxyCODONE (ROXICODONE) 15 MG immediate release tablet Take 1 tablet (15 mg total) by mouth every 6 (six) hours as needed. 30 tablet 0  . polyethylene glycol (MIRALAX / GLYCOLAX) packet Take 17 g by mouth daily as needed.    . senna (SENOKOT) 8.6 MG TABS tablet Take 1 tablet by mouth.    . sucralfate (CARAFATE) 1 G tablet Dissolve 1 tablet in 10 mL H20 and swallow up to QID PRN sore throat 60 tablet 5  . zolpidem (AMBIEN) 10 MG tablet Take 1 tablet (10 mg total) by  mouth at bedtime as needed for sleep. 30 tablet 0   No current facility-administered medications for this visit.   Facility-Administered Medications Ordered in Other Visits  Medication Dose Route Frequency Provider Last Rate Last Dose  . 0.9 %  sodium  chloride infusion   Intravenous Once Eppie Gibson, MD        PHYSICAL EXAMINATION: ECOG PERFORMANCE STATUS: 1 - Symptomatic but completely ambulatory  Filed Vitals:   07/10/14 1311  BP: 119/53  Pulse: 78  Temp: 98.2 F (36.8 C)  Resp: 19   Filed Weights   07/10/14 1311  Weight: 175 lb (79.379 kg)    GENERAL:alert, no distress and comfortable SKIN: Noted mild skin redness around his neck. EYES: normal, Conjunctiva are pink and non-injected, sclera clear OROPHARYNX: Mucositis is present. No thrush. NECK: supple, thyroid normal size, non-tender, without nodularity LYMPH:  Persistent palpable lymphadenopathy in his neck, reduced in size. LUNGS: clear to auscultation and percussion with normal breathing effort HEART: regular rate & rhythm and no murmurs and no lower extremity edema ABDOMEN:abdomen soft, non-tender and normal bowel sounds feeding tube appears to be functioning well with no signs of infection Musculoskeletal:no cyanosis of digits and no clubbing  NEURO: alert & oriented x 3 with fluent speech, no focal motor/sensory deficits  LABORATORY DATA:  I have reviewed the data as listed    Component Value Date/Time   NA 134* 07/02/2014 1240   K 3.9 07/02/2014 1240   CO2 30* 07/02/2014 1240   GLUCOSE 98 07/02/2014 1240   BUN 18.8 07/02/2014 1240   CREATININE 0.8 07/02/2014 1240   CALCIUM 9.3 07/02/2014 1240   PROT 6.5 06/18/2014 1300   ALBUMIN 3.4* 06/18/2014 1300   AST 14 06/18/2014 1300   ALT 12 06/18/2014 1300   ALKPHOS 74 06/18/2014 1300   BILITOT 0.29 06/18/2014 1300    No results found for: SPEP, UPEP  Lab Results  Component Value Date   WBC 3.4* 06/18/2014   NEUTROABS 2.7 06/18/2014   HGB 10.2* 06/18/2014   HCT 29.2* 06/18/2014   MCV 93.3 06/18/2014   PLT 117* 06/18/2014      Chemistry      Component Value Date/Time   NA 134* 07/02/2014 1240   K 3.9 07/02/2014 1240   CO2 30* 07/02/2014 1240   BUN 18.8 07/02/2014 1240   CREATININE 0.8  07/02/2014 1240      Component Value Date/Time   CALCIUM 9.3 07/02/2014 1240   ALKPHOS 74 06/18/2014 1300   AST 14 06/18/2014 1300   ALT 12 06/18/2014 1300   BILITOT 0.29 06/18/2014 1300     ASSESSMENT & PLAN:  Cancer of Right Tonsil The patient was noncompliant and has reschedule his recent appointment and did not show up for some of his appointment. I reinforced the importance of close follow-up. Clinically, he is doing well with his treatment. He will complete his radiation therapy tomorrow. I will see him back in 2 weeks for further supportive care.  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 The patient denies recent history of bleeding such as epistaxis, hematuria or hematochezia. He is asymptomatic from the anemia. I will observe for now.    Mucositis due to chemotherapy This is related to side effects of treatment. We'll continue current prescription pain medicine for pain control. I refilled his prescription and reinforced narcotic refill policy  Protein calorie malnutrition He has recent weight loss due to altered taste sensation and reduced appetite. I recommend he start  increase tube feedings from 4 cans per day to 8 cans if tolerated and I will get nutritionists to follow him up closely.     S/P gastrostomy tube (G tube) placement, follow-up exam The feeding tube appears to be functioning well.  Nausea with vomiting He will continue antiemetics as needed. At present time, he felt that his nausea is well controlled and he declined further IV fluids therapy   Orders Placed This Encounter  Procedures  . CBC with Differential    Standing Status: Future     Number of Occurrences:      Standing Expiration Date: 08/14/2015  . Comprehensive metabolic panel    Standing Status: Future     Number of Occurrences:      Standing Expiration Date: 08/14/2015   All questions were answered. The patient knows to call the clinic with any problems,  questions or concerns. No barriers to learning was detected. I spent 30 minutes counseling the patient face to face. The total time spent in the appointment was 40 minutes and more than 50% was on counseling and review of test results     [facility], [place], MD 07/10/2014 8:55 PM

## 2014-07-10 NOTE — Progress Notes (Signed)
To provide support and encouragement, care continuity and to assess for needs, met with patient during [place] appt with Dr. Alvy Bimler.  He reported: 1. Throat pain rated 4-5/10, taking Oxy IR about twice daily to resolve.   2. Denies constipation, continues to take Miralax every other day. 3. Is consuming chicken broth orally BID per Dr. Charlton Amor guidance. 4. Instilling ca. 1 quart water via PEG daily. 5. Instilling 4 cans boost daily via PEG.  Dr. Alvy Bimler encouraged to increase to 6-8 cans. We discussed importance of maintaining hydration.  Gayleen Orem, RN, BSN, [place] at [place] [phone]

## 2014-07-11 ENCOUNTER — Ambulatory Visit
Admission: RE | Admit: 2014-07-11 | Discharge: 2014-07-11 | Disposition: A | Discharge status: Home / Self Care | Payer: BC Managed Care – PPO | Source: Ambulatory Visit | Attending: Radiation Oncology | Admitting: Radiation Oncology

## 2014-07-11 ENCOUNTER — Encounter: Payer: Self-pay | Admitting: Radiation Oncology

## 2014-07-11 ENCOUNTER — Encounter: Payer: Self-pay | Admitting: *Deleted

## 2014-07-11 ENCOUNTER — Ambulatory Visit (HOSPITAL_BASED_OUTPATIENT_CLINIC_OR_DEPARTMENT_OTHER): Payer: BC Managed Care – PPO

## 2014-07-11 VITALS — BP 88/58 | HR 102 | Temp 98.6°F | Resp 12 | Wt 173.7 lb

## 2014-07-11 DIAGNOSIS — C099 Malignant neoplasm of tonsil, unspecified: Secondary | ICD-10-CM

## 2014-07-11 DIAGNOSIS — Z510 Encounter for antineoplastic radiation therapy: Secondary | ICD-10-CM | POA: Insufficient documentation

## 2014-07-11 DIAGNOSIS — E860 Dehydration: Secondary | ICD-10-CM | POA: Diagnosis not present

## 2014-07-11 DIAGNOSIS — E46 Unspecified protein-calorie malnutrition: Secondary | ICD-10-CM

## 2014-07-11 DIAGNOSIS — L599 Disorder of the skin and subcutaneous tissue related to radiation, unspecified: Secondary | ICD-10-CM | POA: Diagnosis not present

## 2014-07-11 DIAGNOSIS — R112 Nausea with vomiting, unspecified: Secondary | ICD-10-CM

## 2014-07-11 DIAGNOSIS — K1231 Oral mucositis (ulcerative) due to antineoplastic therapy: Secondary | ICD-10-CM

## 2014-07-11 MED ORDER — BIAFINE EX EMUL
Freq: Two times a day (BID) | CUTANEOUS | Status: DC
  Administered 2014-07-11: 16:00:00 via TOPICAL

## 2014-07-11 MED ORDER — SODIUM CHLORIDE 0.9 % IV SOLN
1000.0000 mL | INTRAVENOUS | Status: DC
  Administered 2014-07-11: 14:00:00 via INTRAVENOUS

## 2014-07-11 NOTE — Patient Instructions (Signed)
Dehydration, Adult Dehydration is when you lose more fluids from the body than you take in. Vital organs like the kidneys, brain, and heart cannot function without a proper amount of fluids and salt. Any loss of fluids from the body can cause dehydration.  CAUSES   Vomiting.  Diarrhea.  Excessive sweating.  Excessive urine output.  Fever. SYMPTOMS  Mild dehydration  Thirst.  Dry lips.  Slightly dry mouth. Moderate dehydration  Very dry mouth.  Sunken eyes.  Skin does not bounce back quickly when lightly pinched and released.  Dark urine and decreased urine production.  Decreased tear production.  Headache. Severe dehydration  Very dry mouth.  Extreme thirst.  Rapid, weak pulse (more than 100 beats per minute at rest).  Cold hands and feet.  Not able to sweat in spite of heat and temperature.  Rapid breathing.  Blue lips.  Confusion and lethargy.  Difficulty being awakened.  Minimal urine production.  No tears. DIAGNOSIS  Your caregiver will diagnose dehydration based on your symptoms and your exam. Blood and urine tests will help confirm the diagnosis. The diagnostic evaluation should also identify the cause of dehydration. TREATMENT  Treatment of mild or moderate dehydration can often be done at home by increasing the amount of fluids that you drink. It is best to drink small amounts of fluid more often. Drinking too much at one time can make vomiting worse. Refer to the home care instructions below. Severe dehydration needs to be treated at the hospital where you will probably be given intravenous (IV) fluids that contain water and electrolytes. HOME CARE INSTRUCTIONS   Ask your caregiver about specific rehydration instructions.  Drink enough fluids to keep your urine clear or pale yellow.  Drink small amounts frequently if you have nausea and vomiting.  Eat as you normally do.  Avoid:  Foods or drinks high in sugar.  Carbonated  drinks.  Juice.  Extremely hot or cold fluids.  Drinks with caffeine.  Fatty, greasy foods.  Alcohol.  Tobacco.  Overeating.  Gelatin desserts.  Wash your hands well to avoid spreading bacteria and viruses.  Only take over-the-counter or prescription medicines for pain, discomfort, or fever as directed by your caregiver.  Ask your caregiver if you should continue all prescribed and over-the-counter medicines.  Keep all follow-up appointments with your caregiver. SEEK MEDICAL CARE IF:  You have abdominal pain and it increases or stays in one area (localizes).  You have a rash, stiff neck, or severe headache.  You are irritable, sleepy, or difficult to awaken.  You are weak, dizzy, or extremely thirsty. SEEK IMMEDIATE MEDICAL CARE IF:   You are unable to keep fluids down or you get worse despite treatment.  You have frequent episodes of vomiting or diarrhea.  You have blood or green matter (bile) in your vomit.  You have blood in your stool or your stool looks black and tarry.  You have not urinated in 6 to 8 hours, or you have only urinated a small amount of very dark urine.  You have a fever.  You faint. MAKE SURE YOU:   Understand these instructions.  Will watch your condition.  Will get help right away if you are not doing well or get worse. Document Released: 06/28/2005 Document Revised: 09/20/2011 Document Reviewed: 02/15/2011 ExitCare Patient Information 2015 [employer]. This information is not intended to replace advice given to you by your health care provider. Make sure you discuss any questions you have with your health care   provider.  

## 2014-07-11 NOTE — Addendum Note (Signed)
Encounter addended by: Jenene Slicker, RN on: 07/11/2014  1:21 PM<BR>     Documentation filed: Dx [employer], Orders

## 2014-07-11 NOTE — Addendum Note (Signed)
Encounter addended by: Jenene Slicker, RN on: 07/11/2014  3:04 PM<BR>     Documentation filed: Notes Section

## 2014-07-11 NOTE — Progress Notes (Signed)
Met with pt during final RT to offer support and to celebrate end of radiation treatment.  I explained that my role as navigator will continue for several more months and that I will be calling and/or joining them during follow-up visits.  Escorted him to [place] for dehydration assessment, facilitated scheduling of IVF, escorted him to Infusion.    Gayleen Orem, RN, BSN, [place] at [place] [phone]

## 2014-07-11 NOTE — Progress Notes (Deleted)
BP 88/58 mmHg  Pulse 102  Temp(Src) 98.6 F (37 C) (Oral)  Resp 12  Wt 173 lb 11.2 oz (78.79 kg)  SpO2 100%

## 2014-07-11 NOTE — Addendum Note (Signed)
Encounter addended by: Jenene Slicker, RN on: 07/11/2014  3:39 PM<BR>     Documentation filed: Inpatient MAR

## 2014-07-11 NOTE — Progress Notes (Signed)
He rates his pain as a 4 on a scale of 0-10. Pt complains of sore throat, Fatigue and Generalized Weakness.  Pt has had dysphagia for solids. Pt reports a Boost- 5 cans, Quart of H20. Oral exam reveals thick ropey sputum. Skin exam reveals dry desquamation and erythema. Orthostatic Vitals taken Wt Readings from Last 3 Encounters:  07/11/14 173 lb 11.2 oz (78.79 kg)  07/10/14 175 lb (79.379 kg)  07/08/14 175 lb (79.379 kg)

## 2014-07-11 NOTE — Progress Notes (Signed)
CC: Dr. Eppie Gibson  [name] finishes his radiation therapy today to his oropharynx/neck.  He generally feels well and does not have dizziness with standing but he does have orthostatic vital signs.  He has been taking in chicken broth by mouth, up to 24 ounces a day.  He was told by Dr. Alvy Bimler to increase his nutritional supplements up to 8 cans a day.  He is been taking in only 1 quart of water a day.  His weight is down 2 pounds over the week.  Physical examination: Alert and oriented. Wt Readings from Last 3 Encounters:  07/11/14 173 lb 11.2 oz (78.79 kg)  07/10/14 175 lb (79.379 kg)  07/08/14 175 lb (79.379 kg)   Temp Readings from Last 3 Encounters:  07/11/14 98.6 F (37 C) Oral  07/10/14 98.2 F (36.8 C) Oral  07/08/14 98 F (36.7 C) Oral   BP Readings from Last 3 Encounters:  07/11/14 88/58  07/10/14 119/53  07/08/14 111/62   Pulse Readings from Last 3 Encounters:  07/11/14 102  07/10/14 78  07/08/14 84   CMP     Component Value Date/Time   NA 134* 07/02/2014 1240   K 3.9 07/02/2014 1240   CO2 30* 07/02/2014 1240   GLUCOSE 98 07/02/2014 1240   BUN 18.8 07/02/2014 1240   CREATININE 0.8 07/02/2014 1240   CALCIUM 9.3 07/02/2014 1240   PROT 6.5 06/18/2014 1300   ALBUMIN 3.4* 06/18/2014 1300   AST 14 06/18/2014 1300   ALT 12 06/18/2014 1300   ALKPHOS 74 06/18/2014 1300   BILITOT 0.29 06/18/2014 1300    Impression: I believe that he is still dehydrated.  We will give him 1 L of normal saline this afternoon.  He is to improve his fluid and salt intake.  In addition to one quarter for him would like for him to take in 32 ounces of Gatorade or another sport drink to help with his potassium as well.  Plan: Follow-up visit with Dr. Isidore Moos on Monday to assess his hydration.

## 2014-07-11 NOTE — Addendum Note (Signed)
Encounter addended by: Jenene Slicker, RN on: 07/11/2014  3:53 PM<BR>     Documentation filed: Notes Section

## 2014-07-11 NOTE — Addendum Note (Signed)
Encounter addended by: Jenene Slicker, RN on: 07/11/2014  3:58 PM<BR>     Documentation filed: Notes Section

## 2014-07-12 NOTE — Progress Notes (Signed)
  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  He did not receive a complete regimen of CDDP.  He required IV fluids for N/V/ Deydration.  Plan: The patient has completed radiation treatment. The patient will return to radiation oncology clinic for routine followup in one week. I advised them to call or return sooner if they have any questions or concerns related to their recovery or treatment.  -----------------------------------  Eppie Gibson, MD

## 2014-07-15 ENCOUNTER — Encounter: Payer: Self-pay | Admitting: *Deleted

## 2014-07-15 ENCOUNTER — Ambulatory Visit
Admission: RE | Admit: 2014-07-15 | Discharge: 2014-07-15 | Disposition: A | Discharge status: Home / Self Care | Payer: BC Managed Care – PPO | Source: Ambulatory Visit | Attending: Radiation Oncology | Admitting: Radiation Oncology

## 2014-07-15 ENCOUNTER — Other Ambulatory Visit: Payer: Self-pay | Admitting: Radiation Oncology

## 2014-07-15 ENCOUNTER — Ambulatory Visit: Payer: BC Managed Care – PPO

## 2014-07-15 VITALS — BP 117/64 | HR 82 | Temp 98.2°F | Resp 20 | Wt 174.1 lb

## 2014-07-15 DIAGNOSIS — C099 Malignant neoplasm of tonsil, unspecified: Secondary | ICD-10-CM

## 2014-07-15 LAB — BASIC METABOLIC PANEL (CC13)
ANION GAP: 7 meq/L (ref 3–11)
BUN: 15.2 mg/dL (ref 7.0–26.0)
CO2: 33 meq/L — AB (ref 22–29)
CREATININE: 0.7 mg/dL (ref 0.7–1.3)
Calcium: 9.1 mg/dL (ref 8.4–10.4)
Chloride: 97 mEq/L — ABNORMAL LOW (ref 98–109)
Glucose: 113 mg/dl (ref 70–140)
Potassium: 4.1 mEq/L (ref 3.5–5.1)
SODIUM: 137 meq/L (ref 136–145)

## 2014-07-15 NOTE — Progress Notes (Signed)
Radiation Oncology         [phone] ________________________________  Name: [name] MRN: [id number]  Date: 07/15/2014  DOB: [birth year]  Follow-Up Visit Note  CC: Pcp Not In System  Jerrell Belfast, MD  Diagnosis and Prior Radiotherapy:       ICD-9-CM ICD-10-CM   1. 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       Patient here for assessment of hydration status post completion of radiation to oropharnyx on 07/11/14. Current intake is 4 to 5 bottles of boost/day and states oral water intake is 2 quarts. Denies pain or dizziness. Has some weakness.   Orthostatic vitals 117/64 82 sitting and 91/65 91 standing.      Feeling better overall. Pain well controlled. Sees med/onc next week. Urine is clear.     Denies depression. Mood improving.  ALLERGIES:  has No Known Allergies.  Meds: Current Outpatient Prescriptions  Medication Sig Dispense Refill  . emollient (BIAFINE) cream Apply 1 application topically 2 (two) times daily. Can apply BID or TID    . lidocaine-prilocaine (EMLA) cream   3  . oxyCODONE (ROXICODONE) 15 MG immediate release tablet Take 1 tablet (15 mg total) by mouth every 6 (six) hours as needed. 30 tablet 0  . polyethylene glycol (MIRALAX / GLYCOLAX) packet Take 17 g by mouth daily as needed.    . lidocaine (XYLOCAINE) 2 % solution Patient: Mix 1part 2% viscous lidocaine, 1part H20. Swish and/or swallow 52mL of this mixture, 57min before meals and at bedtime, up to QID (Patient not taking: Reported on 06/18/2014) 100 mL 5  . senna (SENOKOT) 8.6 MG TABS tablet Take 1 tablet by mouth.    . sucralfate (CARAFATE) 1 G tablet Dissolve 1 tablet in 10 mL H20 and swallow up to QID PRN sore throat (Patient not taking: Reported on 07/15/2014) 60 tablet 5  . zolpidem (AMBIEN) 10 MG tablet Take 1 tablet (10 mg total) by mouth at bedtime as needed for sleep.  (Patient not taking: Reported on 07/15/2014) 30 tablet 0   No current facility-administered medications for this encounter.   Facility-Administered Medications Ordered in Other Encounters  Medication Dose Route Frequency Provider Last Rate Last Dose  . 0.9 %  sodium chloride infusion   Intravenous Once Eppie Gibson, MD        Physical Findings:  Wt Readings from Last 3 Encounters:  07/15/14 174 lb 1.6 oz (78.971 kg)  07/11/14 173 lb 11.2 oz (78.79 kg)  07/10/14 175 lb (79.379 kg)    The patient is in no acute distress. Patient is alert and oriented.  weight is 174 lb 1.6 oz (78.971 kg). His temperature is 98.2 F (36.8 C). His blood pressure is 117/64 and his pulse is 82. His respiration is 20 and oxygen saturation is 100%. .  Brisk gag, resolving mucositis, moist mucosa, neck without palpable adenopathy. Skin dry.  Lab Findings: Lab Results  Component Value Date   WBC 3.4* 06/18/2014   HGB 10.2* 06/18/2014   HCT 29.2* 06/18/2014   MCV 93.3 06/18/2014   PLT 117* 06/18/2014   CMP     Component Value Date/Time   NA 137 07/15/2014 1150   K 4.1 07/15/2014 1150   CO2 33* 07/15/2014 1150   GLUCOSE 113 07/15/2014 1150   BUN 15.2 07/15/2014 1150   CREATININE 0.7 07/15/2014 1150  CALCIUM 9.1 07/15/2014 1150   PROT 6.5 06/18/2014 1300   ALBUMIN 3.4* 06/18/2014 1300   AST 14 06/18/2014 1300   ALT 12 06/18/2014 1300   ALKPHOS 74 06/18/2014 1300   BILITOT 0.29 06/18/2014 1300     Lab Results  Component Value Date   TSH 0.756 05/13/2014    Radiographic Findings: No results found.  Impression/Plan:    1) Head and Neck Cancer Status: healing from RT  2) Nutritional Status:  - weight: stabilizing - PEG tube: supplementing with this  3) Risk Factors: The patient has been educated about risk factors including alcohol and tobacco abuse; they understand that avoidance of alcohol and tobacco is important to prevent recurrences as well as other cancers. Prior heavy ETOH.  4)  Swallowing: no issues  5) Dental: Encouraged to continue regular followup with dentistry, and dental hygiene including fluoride rinses.   6) Thyroid function: will recheck TSH q 6-12 mo  7) Social: No active social issues to address at this time. Has patient mentor for support.   8) Other: hydration improving. BMP satisfactory.  PT will let us know if PO intake worsens, and if he wants to be considered for IV fluids.  9) Follow-up in 6 weeks ; The patient was encouraged to call with any issues or questions before then.  _____________________________________   Eppie Gibson, MD

## 2014-07-15 NOTE — Progress Notes (Signed)
Patient here for assessment of hydration status post completion of radiation to oropharnyx on 07/11/14.Current intake is 4 to 5 bottles of boost/day and states oral water intake is 2 quarts.Denies pain or dizziness.Has some weakness.Skin in treatment field looks good.Awaiting bmet results drawn today. Orthostatic vitals 117/64 82 sitting and 91/65 91 standing.

## 2014-07-17 ENCOUNTER — Ambulatory Visit: Payer: Self-pay | Admitting: Hematology and Oncology

## 2014-07-18 ENCOUNTER — Telehealth: Payer: Self-pay | Admitting: *Deleted

## 2014-07-18 NOTE — Telephone Encounter (Signed)
Called patient to check on well-being since Monday's appt with Dr. Isidore Moos.  LVM asking him to return my call.  Gayleen Orem, RN, BSN, [place] at [place] [phone]

## 2014-07-18 NOTE — Telephone Encounter (Signed)
Patient returned my call.  He reported: 1. "I'm doing well". 2. Taking in 64 oz water or more daily, some by mouth, most by PEG. 3. Eating cream soups.  Going to try eggs later today. 4. Walking outside daily. 5. Sitting on bedside in morning before he gets up to avoid dizziness. 6. Denies any sense he is dehydrated.  He acknowledged familiarity with symptoms from previous episodes, stated he would call if he felt he needed IVF. 7. Understanding of keeping appt with Dr. Alvy Bimler next Wednesday to continue monitoring well-being/hydration status.  Gayleen Orem, RN, BSN, [place] at [place] [phone]

## 2014-07-18 NOTE — Progress Notes (Signed)
To provide support and encouragement, care continuity and to assess for needs, met with patient during f/u appt with Dr. Isidore Moos: 1. He reported increased fluid intake ("2 quarts a day") including almost daily chicken broth. 2. VS orthostatic but denies dizziness.  Labs do not indicate dehydration. 3. Encouraged by Dr. Isidore Moos to notify Navigator or her nurse if he feels like he needs IVF later this week.  4. He verbalized understanding of the importance of keeping next week's Wed appt with Dr. Alvy Bimler.  Gayleen Orem, RN, BSN, [place] at [place] [phone]

## 2014-07-19 ENCOUNTER — Telehealth: Payer: Self-pay | Admitting: *Deleted

## 2014-07-19 ENCOUNTER — Ambulatory Visit: Payer: BC Managed Care – PPO | Admitting: Radiation Oncology

## 2014-07-19 NOTE — Telephone Encounter (Signed)
Pt left VM states he wants to cancel his appt w/ dr. Alvy Bimler on 1/13.  He did not give a reason.  I called pt back and left him a VM to ask him why he needs to cancel and asked him if he can r/s to another day?

## 2014-07-19 NOTE — Telephone Encounter (Signed)
OK, I will cancel

## 2014-07-22 ENCOUNTER — Telehealth: Payer: Self-pay | Admitting: *Deleted

## 2014-07-22 NOTE — Telephone Encounter (Signed)
Called patient to check on well-being and in follow-up to his cancellation of 07/24/14 appt with Dr. Alvy Bimler: 1. He reported:  Cancelled appt b/c he is feeling very good, "would be a waste of her any my time".  Staying well hydrated:  Drinking 64 oz plus water daily, flushing tube with excess of 60 cc daily.  Instilling 5 cans Boost daily via PEG.  Pain level reaches 3-4/10 sometimes twice daily, resolves to 2/10 with Oxy IR 15 mg.  He states he has ca. 25 tablets, is confident this will suffice until he sees Dr. Isidore Moos in February.  Denies constipation, having BMs every other day without aid of Miralax/softner, his norm.   Taking long walks daily. 2. He understands his cancellation of appts with MDs makes it difficult for them to assess his post-tmt progress, identify medication needs. 3. He understands he can contact me.  Gayleen Orem, RN, BSN, [place] at [place] [phone]

## 2014-07-24 ENCOUNTER — Other Ambulatory Visit: Payer: Self-pay

## 2014-07-24 ENCOUNTER — Ambulatory Visit: Payer: Self-pay | Admitting: Hematology and Oncology

## 2014-08-01 ENCOUNTER — Telehealth: Payer: Self-pay | Admitting: *Deleted

## 2014-08-02 NOTE — Telephone Encounter (Addendum)
Patient called. He requested Rx for oxyCODONE (ROXICODONE) 15 MG immediate release tablet.  "My throat is as sore as its ever been."  Taking 1 to 1 1/2 tabs daily for pain rated 4/10, resolves to 2/10 with medication.  Has enough med to see him through the weekend but wd like to pick up Rx on Monday. In response to my inquiry, he reported:  Except for throat, "feeling much better".  Denies light headedness when getting up.  All hydration and nutrition via oral intake.  Drinking at least 2 quarts of water daily.  Drinking at least 4 cans boost daily.  Eating chicken soup, eggs.  Flushing PEG daily.  Has not had BM since last Saturday (5 days ago).  Started eating prunes, drinking Miralax daily.  I suggested Miralax BID until regularity maintained.  He verbalized understanding. Dr. Isidore Moos notified.   He understands he can contact me with needs/concerns.  Gayleen Orem, RN, BSN, [place] at [place]  Gayleen Orem, RN, BSN, [place] at [place] [phone]

## 2014-08-05 ENCOUNTER — Telehealth: Payer: Self-pay | Admitting: *Deleted

## 2014-08-05 ENCOUNTER — Other Ambulatory Visit: Payer: Self-pay | Admitting: Radiation Oncology

## 2014-08-05 DIAGNOSIS — C099 Malignant neoplasm of tonsil, unspecified: Secondary | ICD-10-CM

## 2014-08-05 MED ORDER — OXYCODONE HCL 10 MG PO TABS: 10.0000 mg | ORAL_TABLET | Freq: Four times a day (QID) | ORAL | Status: DC | PRN

## 2014-08-05 NOTE — Telephone Encounter (Signed)
Patient called, reported: Understanding he can pick up oxy IR Rx at [place].  We discussed reasoning for lower dosage. Current throat pain a persistent 3/10.    Is not using lidocaine rinse to sooth throat but rather has been taking Oxy IR.  I encouraged him to use lidocaine rinse as prescribed to minimize constipation issues d/t opioid use. Had BM Saturday s/p couple days of BID Miralax.    I encouraged him to take daily to maintain regularity, increase to BID if it has been > 3 days since having a BM.  I shared per Dr. Pearlie Oyster guidance that OTC Mag Citrate is an option to relieve constipation. Patient verbalized understanding of information provided, understands he can contact me with needs/concerns.  Gayleen Orem, RN, BSN, [place] at [place] [phone]

## 2014-08-05 NOTE — Telephone Encounter (Signed)
Called pt, LVM indicating Rx for Oxy IR is available for his pick-up at [place].  I asked that he call me to provide update on BM.  Gayleen Orem, RN, BSN, [place] at [place] [phone]

## 2014-08-12 ENCOUNTER — Telehealth (HOSPITAL_COMMUNITY): Payer: Self-pay

## 2014-08-12 NOTE — Telephone Encounter (Signed)
08/12/14                Patient call Dental Medicine and left msg. cancelling appt. for S/P XRT CK on 08/15/14 @ 1:00.  Patient to call back to reschedule.  LRI

## 2014-08-15 ENCOUNTER — Ambulatory Visit (HOSPITAL_COMMUNITY): Payer: Self-pay | Admitting: Dentistry

## 2014-08-19 ENCOUNTER — Telehealth: Payer: Self-pay | Admitting: *Deleted

## 2014-08-19 NOTE — Telephone Encounter (Signed)
Patient called to report that he needs a refill for Oxycodone.  Taking BID for throat pain of 4/10, reduces to 2/10 with medication.  Would like to pick up Rx on Wed when he has f/u appt with Dr. Isidore Moos.   He further reported:  Persistent ear ringing that is slowly dissipating.  Sense of taste slowly returning.  Having regular BMs without aid of Miralax.  Persistent dry mouth, worse at night.  Has not returned to work yet, expects to return mid-March.  Feels fatigued, chilled, hands always cold.  Denies having fevers. He confirmed his understanding of his 11:20 am appt with Dr. Isidore Moos this Wednesday. Dr. Isidore Moos informed.  Gayleen Orem, RN, BSN, [place] at [place] [phone]

## 2014-08-20 ENCOUNTER — Encounter: Payer: Self-pay | Admitting: *Deleted

## 2014-08-21 ENCOUNTER — Encounter: Payer: Self-pay | Admitting: Radiation Oncology

## 2014-08-21 ENCOUNTER — Other Ambulatory Visit: Payer: Self-pay | Admitting: *Deleted

## 2014-08-21 ENCOUNTER — Encounter: Payer: Self-pay | Admitting: *Deleted

## 2014-08-21 ENCOUNTER — Telehealth: Payer: Self-pay | Admitting: Hematology and Oncology

## 2014-08-21 ENCOUNTER — Ambulatory Visit
Admission: RE | Admit: 2014-08-21 | Discharge: 2014-08-21 | Disposition: A | Discharge status: Home / Self Care | Payer: BLUE CROSS/BLUE SHIELD | Source: Ambulatory Visit | Attending: Radiation Oncology | Admitting: Radiation Oncology

## 2014-08-21 DIAGNOSIS — C099 Malignant neoplasm of tonsil, unspecified: Secondary | ICD-10-CM

## 2014-08-21 MED ORDER — OXYCODONE HCL 5 MG PO TABS: 5.0000 mg | ORAL_TABLET | Freq: Two times a day (BID) | ORAL | Status: DC | PRN

## 2014-08-21 NOTE — Telephone Encounter (Signed)
s.w. pt and advised on appt....pt ok and aware to pick up new sched tomorrow

## 2014-08-21 NOTE — Progress Notes (Signed)
To provide support and encouragement, care continuity and to assess for needs, met with patient during 6-week follow-up with Dr. Isidore Moos. He reported: 1. Bilateral ear-ringing that is dissipating in persistency. 2. Throat pain at 3/10, resolves with PRN Oxycodone BID. 3. Eating some solid food eg chicken, mac & cheese, casseroles; drinking Boost BID; sense of taste returning.    We discussed that since swallowing has improved and taste is returning, he can set goal of trying at least 3 new foods weekly and to keep a food diary of what is palatable and easy to swallow, to try foods again that don't meet these criteria. 4. Flushing tube daily. 5. Has not had PAC flushed since completing chemo.    I indicated I would notify Dr. Alvy Bimler, that he will be contacted by [facility] for flush appt. 6. Has appt with primary dentist in March; continues to use fluoride trays daily per Dr. Ritta Slot guidance. 7. Continued fatigue but walks about 6 blocks daily, understands the need to build stamina post-tmt. He verbalized understanding of Dr. Pearlie Oyster indication: 1. Removal of PEG will be evaluated in a month, can be removed if weight reflects stability. 2. Oxycodone Rx will be tapered to 5 mg. 3. Will be referred back to Dr. Wilburn Cornelia, ENT, after 29-monthPET. He understands he can contact me with needs/concerns.  RGayleen Orem RN, BSN, [place] [phone]

## 2014-08-21 NOTE — Progress Notes (Addendum)
Pain Status: 3/10 on pain scale, in throat, takes Oxycodone 10 mg twice daily  Weight changes, if any: lost 4 lbs in past month  Nutritional Status a) intake: soft foods, liquids, 2 cans Boost daily, has eaten some chicken without difficulty b) using a feeding tube?: no, only flushing daily, has not used in past 3 weeks c) weight changes, if any:   Swallowing Status: good  Dental (if applicable): When was last visit with dentistry 06/11/14 with Dr Enrique Sack,   Using fluoride trays daily? Yes   He states he cancelled FU with Dr Enrique Sack last week because he is seeing his regular dentist.    When was last ENT visit?  Prior to radiation treatments When is next ENT visit? None scheduled  Other notable issues, if any: mouth dry in mornings, suggested humidifier which he has at home, taste buds normal, skin in neck treatment area healed- he continues to apply lotion. Patient states all side effects are slowly improving. He states he cancelled his Jan appointment with Dr Alvy Bimler because he hd recently seen Dr Isidore Moos.

## 2014-08-21 NOTE — Progress Notes (Addendum)
Radiation Oncology         [phone] ________________________________  Name: [name] MRN: [id number]  Date: 08/21/2014  DOB: July 07, 1951  Follow-Up Visit Note  CC: Pcp Not In System  Jerrell Belfast, MD  Diagnosis and Prior Radiotherapy:       ICD-9-CM ICD-10-CM   1. 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 Feeling much better. Pain Status: 3/10 on pain scale, in throat, takes Oxycodone 10 mg twice daily  Weight changes, if any: lost 4 lbs in past month  Nutritional Status a) intake: soft foods, liquids, 2 cans Boost daily, has eaten some chicken without difficulty b) using a feeding tube?: no, only flushing daily, has not used in past 3 weeks   Swallowing Status: good  Dental (if applicable): When was last visit with dentistry 06/11/14 with Dr Enrique Sack,   Using fluoride trays daily? Yes   He states he cancelled FU with Dr Enrique Sack last week because he is seeing his regular dentist.    When was last ENT visit?  Prior to radiation treatments When is next ENT visit? None scheduled  Other notable issues, if any: mouth dry in mornings, suggested humidifier which he has at home, taste buds normal, skin in neck treatment area healed- he continues to apply lotion. Patient states all side effects are slowly improving. He states he cancelled his Jan appointment with Dr Alvy Bimler because he hd recently seen Dr Isidore Moos.    Abstaining from ETOH and tobacco.   ALLERGIES:  has No Known  Allergies.  Meds: Current Outpatient Prescriptions  Medication Sig Dispense Refill  . lidocaine (XYLOCAINE) 2 % solution Patient: Mix 1part 2% viscous lidocaine, 1part H20. Swish and/or swallow 13mL of this mixture, 72min before meals and at bedtime, up to QID (Patient not taking: Reported on 06/18/2014) 100 mL 5  . lidocaine-prilocaine (EMLA) cream   3  . oxyCODONE 10 MG TABS Take 1 tablet (10 mg total) by mouth every 6 (six) hours as needed. 30 tablet 0  . polyethylene glycol (MIRALAX / GLYCOLAX) packet Take 17 g by mouth daily as needed.    . senna (SENOKOT) 8.6 MG TABS tablet Take 1 tablet by mouth.    . sucralfate (CARAFATE) 1 G tablet Dissolve 1 tablet in 10 mL H20 and swallow up to QID PRN sore throat (Patient not taking: Reported on 07/15/2014) 60 tablet 5  . zolpidem (AMBIEN) 10 MG tablet Take 1 tablet (10 mg total) by mouth at bedtime as needed for sleep. (Patient not taking: Reported on 07/15/2014) 30 tablet 0   No current facility-administered medications for this encounter.   Facility-Administered Medications Ordered in Other Encounters  Medication Dose Route Frequency Provider Last Rate Last Dose  . 0.9 %  sodium chloride infusion   Intravenous Once Eppie Gibson, MD        Physical Findings:  Wt Readings from Last 3 Encounters:  07/15/14 174 lb 1.6 oz (78.971 kg)  07/10/14 175 lb (79.379 kg)  07/08/14 175 lb (79.379 kg)    The patient is in no acute distress. Patient is alert and oriented.  weight is 170 lb 9.6 oz (77.384 kg). His oral temperature is 97.5 F (36.4 C). His blood pressure is 118/66 and his pulse is 65. His respiration is 20.   Oropharyngeal mucosa is intact with no thrush or lesions.  I do appreciate a 2cm node in the right upper neck.   Skin intact and smooth over neck.      Lab Findings: Lab Results  Component Value Date   WBC 3.4* 06/18/2014   HGB 10.2* 06/18/2014   HCT 29.2* 06/18/2014   MCV 93.3 06/18/2014   PLT 117* 06/18/2014   CMP      Component Value Date/Time   NA 137 07/15/2014 1150   K 4.1 07/15/2014 1150   CO2 33* 07/15/2014 1150   GLUCOSE 113 07/15/2014 1150   BUN 15.2 07/15/2014 1150   CREATININE 0.7 07/15/2014 1150   CALCIUM 9.1 07/15/2014 1150   PROT 6.5 06/18/2014 1300   ALBUMIN 3.4* 06/18/2014 1300   AST 14 06/18/2014 1300   ALT 12 06/18/2014 1300   ALKPHOS 74 06/18/2014 1300   BILITOT 0.29 06/18/2014 1300     Lab Results  Component Value Date   TSH 0.756 05/13/2014    Radiographic Findings: No results found.  Impression/Plan:    1) Head and Neck Cancer Status: healing from RT  2) Nutritional Status:  - weight: slightly decreased - PEG tube: supplementing with this  3) Risk Factors: The patient has been educated about risk factors including alcohol and tobacco abuse; they understand that avoidance of alcohol and tobacco is important to prevent recurrences as well as other cancers. Prior heavy ETOH. Abstaining.  4) Swallowing: no issues  5) Dental: Encouraged to continue regular followup with dentistry, and dental hygiene including fluoride rinses.   6) Thyroid function: will recheck TSH q 6-12 mo  7) Social: Has cancelled appts with other MDs but did not realize importance of this visits.  Will re-refer to Dr Enrique Sack and Alvy Bimler. Pt says he will attend these appointments.  8) Other: 2cm persistent node in right upper neck that  I did not note last month.  Will ask Dr. Alvy Bimler to monitor this at next appt.  9) Follow-up in mid April following PET scan.  _____________________________________   Eppie Gibson, MD

## 2014-08-22 ENCOUNTER — Ambulatory Visit (HOSPITAL_BASED_OUTPATIENT_CLINIC_OR_DEPARTMENT_OTHER): Payer: BLUE CROSS/BLUE SHIELD

## 2014-08-22 VITALS — BP 119/64 | HR 61 | Temp 97.7°F

## 2014-08-22 DIAGNOSIS — C099 Malignant neoplasm of tonsil, unspecified: Secondary | ICD-10-CM

## 2014-08-22 DIAGNOSIS — Z95828 Presence of other vascular implants and grafts: Secondary | ICD-10-CM

## 2014-08-22 DIAGNOSIS — Z452 Encounter for adjustment and management of vascular access device: Secondary | ICD-10-CM

## 2014-08-22 MED ORDER — HEPARIN SOD (PORK) LOCK FLUSH 100 UNIT/ML IV SOLN
500.0000 [IU] | Freq: Once | INTRAVENOUS | Status: AC
  Administered 2014-08-22: 500 [IU] via INTRAVENOUS
  Filled 2014-08-22: qty 5

## 2014-08-22 MED ORDER — SODIUM CHLORIDE 0.9 % IJ SOLN
10.0000 mL | INTRAMUSCULAR | Status: DC | PRN
  Administered 2014-08-22: 10 mL via INTRAVENOUS
  Filled 2014-08-22: qty 10

## 2014-08-22 NOTE — Patient Instructions (Signed)

## 2014-08-23 ENCOUNTER — Telehealth: Payer: Self-pay | Admitting: Hematology and Oncology

## 2014-08-23 ENCOUNTER — Telehealth: Payer: Self-pay | Admitting: *Deleted

## 2014-08-23 NOTE — Telephone Encounter (Signed)
s.wEnid Derry in radonc and sched pt for appt in March...she will advised pt on appt.Marland KitchenMarland Kitchen

## 2014-08-23 NOTE — Telephone Encounter (Signed)
CALLED PATIENT TO INFORM OF FU VISIT WITH DR. Alvy Bimler ON 09-19-14 - ARRIVAL TIME - 1:15 PM, SPOKE WITH PATIENT AND HE IS AWARE OF THIS APPT.

## 2014-08-23 NOTE — Telephone Encounter (Signed)
CALLED PATIENT TO INFORM OF APPTS. AND LAB AND SCAN AND FU, LVM FOR A RETURN CALL

## 2014-08-28 ENCOUNTER — Telehealth (HOSPITAL_COMMUNITY): Payer: Self-pay

## 2014-08-28 ENCOUNTER — Ambulatory Visit (HOSPITAL_COMMUNITY): Payer: Self-pay | Admitting: Dentistry

## 2014-08-28 NOTE — Telephone Encounter (Signed)
08/28/14                Patient left msg. cancelling F/U appt on 08/28/14 @11 :30 w/Dr. Enrique Sack .  Patient stated he would call back at a later date to reschedule.  LRI

## 2014-09-05 ENCOUNTER — Telehealth: Payer: Self-pay | Admitting: *Deleted

## 2014-09-05 NOTE — Telephone Encounter (Signed)
Pt called to say he would like PEG removed as soon as possible.  1. He stated:  PEG is a psychological barrier at this point to his post-tmt recovery.  Tube is increasingly uncomfortable with his increasing activity.  He would like it removed before his dtrs visit on 09/20/14. 2. He understands that typically MD places order for removal upon f/u visit, that his next MD appt is 09/19/14 with Dr. Alvy Bimler.    3. He reported:  Weight is 170 lbs based on recent visit with PCP.  All hydration/nutritional intake is by mouth.  All foods tolerable/agreeable except for bread.  Taste buds 80% of baseline.  Dry mouth continues, intermittently worse some days more than others.  Energy level increasing, the need for afternoon naps absent this week. 4.   I noted I would forward his request/information to Drs. Alvy Bimler and Isidore Moos.  Gayleen Orem, RN, BSN, [place] at [place] [phone]

## 2014-09-06 ENCOUNTER — Telehealth: Payer: Self-pay | Admitting: *Deleted

## 2014-09-06 ENCOUNTER — Other Ambulatory Visit: Payer: Self-pay | Admitting: Radiation Oncology

## 2014-09-06 ENCOUNTER — Other Ambulatory Visit: Payer: Self-pay | Admitting: Hematology and Oncology

## 2014-09-06 DIAGNOSIS — Z09 Encounter for follow-up examination after completed treatment for conditions other than malignant neoplasm: Secondary | ICD-10-CM

## 2014-09-06 DIAGNOSIS — C099 Malignant neoplasm of tonsil, unspecified: Secondary | ICD-10-CM

## 2014-09-06 NOTE — Telephone Encounter (Signed)
CALLED PATIENT TO INFORM OF PEG TUBE REMOVAL ON 09-10-14- ARRIVAL TIME 3 PM @ [facility], SPOKE WITH PATIENT AND HE IS AWARE OF THIS APPT.

## 2014-09-06 NOTE — Telephone Encounter (Signed)
I see Dr. Isidore Moos has placed order to have it removed

## 2014-09-10 ENCOUNTER — Ambulatory Visit (HOSPITAL_COMMUNITY)
Admission: RE | Admit: 2014-09-10 | Discharge: 2014-09-10 | Disposition: A | Discharge status: Home / Self Care | Payer: BLUE CROSS/BLUE SHIELD | Source: Ambulatory Visit | Attending: Interventional Radiology | Admitting: Interventional Radiology

## 2014-09-10 DIAGNOSIS — C099 Malignant neoplasm of tonsil, unspecified: Secondary | ICD-10-CM

## 2014-09-10 DIAGNOSIS — Z85818 Personal history of malignant neoplasm of other sites of lip, oral cavity, and pharynx: Secondary | ICD-10-CM | POA: Diagnosis not present

## 2014-09-10 DIAGNOSIS — Z431 Encounter for attention to gastrostomy: Secondary | ICD-10-CM | POA: Diagnosis present

## 2014-09-10 MED ORDER — LIDOCAINE VISCOUS 2 % MT SOLN: 15.0000 mL | Freq: Once | OROMUCOSAL | Status: DC

## 2014-09-10 MED ORDER — LIDOCAINE VISCOUS 2 % MT SOLN
OROMUCOSAL | Status: AC
  Filled 2014-09-10: qty 15

## 2014-09-10 NOTE — Procedures (Signed)
9 french bumper retention gastrostomy tube removed in its entirety without immediate complications. Gauze dressing was applied over site. Pt had mild vasovagal response post removal which responded well to fluid intake and Trendelenburg positioning. VSS at discharge.

## 2014-09-19 ENCOUNTER — Ambulatory Visit (HOSPITAL_BASED_OUTPATIENT_CLINIC_OR_DEPARTMENT_OTHER): Payer: BLUE CROSS/BLUE SHIELD | Admitting: Hematology and Oncology

## 2014-09-19 ENCOUNTER — Other Ambulatory Visit: Payer: Self-pay | Admitting: Hematology and Oncology

## 2014-09-19 ENCOUNTER — Encounter: Payer: Self-pay | Admitting: Hematology and Oncology

## 2014-09-19 ENCOUNTER — Encounter: Payer: Self-pay | Admitting: *Deleted

## 2014-09-19 VITALS — BP 133/66 | HR 78 | Temp 98.3°F | Resp 18 | Ht 74.0 in | Wt 170.0 lb

## 2014-09-19 DIAGNOSIS — H9313 Tinnitus, bilateral: Secondary | ICD-10-CM

## 2014-09-19 DIAGNOSIS — I890 Lymphedema, not elsewhere classified: Secondary | ICD-10-CM

## 2014-09-19 DIAGNOSIS — C099 Malignant neoplasm of tonsil, unspecified: Secondary | ICD-10-CM

## 2014-09-19 NOTE — Assessment & Plan Note (Signed)
He has chronic tinnitus since chemotherapy. This is slowly improving. He is taking oxycodone for this. I told him to discontinue oxycodone and it should not help with tinnitus. I recommend the patient weaned himself off pain medicine.

## 2014-09-19 NOTE — Progress Notes (Signed)
To provide support and encouragement, care continuity and to assess for needs, met with during est pt appt with Dr. Alvy Bimler. 1. To address his concerns about neck lymphedema, I am scheduling him for a follow-up appt with Serafina Royals, PT Lymphedema Specialist, during next week's H&N MDC.    Patient is aware of this referral and has agreed to [facility] attendance. 2. He did not express any additional needs or concerns at this time, I encouraged him to contact me if that changes, he verbalized understanding.  Gayleen Orem, RN, BSN, [place] at [place] [phone]

## 2014-09-19 NOTE — Assessment & Plan Note (Signed)
Clinically, he has complete response to treatment. PET CT scan is ordered for next month. Once we confirm he  has no residual disease, we will get the port removed.

## 2014-09-19 NOTE — Assessment & Plan Note (Signed)
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 I recommend referral to physical therapy for management.

## 2014-09-19 NOTE — Progress Notes (Signed)
[facility] OFFICE PROGRESS NOTE  Patient Care Team: Pcp Not In System as PCP - General Leota Sauers, RN as Oncology Nurse Navigator Eppie Gibson, MD as Attending Physician (Radiation Oncology) Karie Mainland, RD as Dietitian (Nutrition) Heath Lark, MD as Consulting Physician (Hematology and Oncology)  SUMMARY OF ONCOLOGIC HISTORY: Oncology History   Cancer of Right Tonsil, HPV positive   Staging form: Pharynx - Oropharynx, AJCC 7th Edition     Clinical stage from 05/21/2014: Stage IVA (T2, N2, M0) - Signed by Heath Lark, MD on 05/21/2014        Cancer of Right Tonsil   03/15/2014 Imaging Ct scan of neck showed bulky adenopathy in the right neck, likely metastatic squamous cell carcinoma.   03/20/2014 Procedure Accession: [id number] FNA of right neck lymph node is non-diagnostic   03/29/2014 Imaging PET/CT showed mild uptake bilateral tonsil and extensive right cervical chain lymphadenopathy with malignant range hypermetabolism   04/19/2014 Surgery Direct laryngoscopy was normal and tonsillectomy was performed   04/19/2014 Pathology Results Accession: [id number] right tonsil showed squamous cell carcinoma, P16 positive   05/21/2014 Procedure the patient has placement of port and feeding tube.   05/22/2014 - 06/19/2014 Chemotherapy He received high-dose cisplatin. The patient only received 2 cycles of treatment and declined further treatment.   05/22/2014 - 07/11/2014 Radiation Therapy He received radiation treatment.   06/11/2014 Adverse Reaction Cycle 2 of chemotherapy is delayed due to leukopenia. Dose will be adjusted due to hearing loss/tinnitus as well.   09/10/2014 Procedure Gastrostomy tube was removed.    INTERVAL HISTORY: Please see below for problem oriented charting. He returns for further follow-up. Feeding tube was removed. His taste perception is improving. He still had persistent dry mouth and mild dysphagia. He complained of persistent tinnitus. He denies  hearing loss.  REVIEW OF SYSTEMS:   Constitutional: Denies fevers, chills or abnormal weight loss Eyes: Denies blurriness of vision Ears, nose, mouth, throat, and face: Denies mucositis or sore throat Respiratory: Denies cough, dyspnea or wheezes Cardiovascular: Denies palpitation, chest discomfort or lower extremity swelling Gastrointestinal:  Denies nausea, heartburn or change in bowel habits Skin: Denies abnormal skin rashes Lymphatics: Denies new lymphadenopathy or easy bruising Neurological:Denies numbness, tingling or new weaknesses Behavioral/Psych: Mood is stable, no new changes  All other systems were reviewed with the patient and are negative.  I have reviewed the past medical history, past surgical history, social history and family history with the patient and they are unchanged from previous note.  ALLERGIES:  has No Known Allergies.  MEDICATIONS:  Current Outpatient Prescriptions  Medication Sig Dispense Refill  . oxyCODONE (OXY IR/ROXICODONE) 5 MG immediate release tablet Take 1 tablet (5 mg total) by mouth 2 (two) times daily as needed for severe pain. 60 tablet 0  . zolpidem (AMBIEN) 10 MG tablet Take 1 tablet (10 mg total) by mouth at bedtime as needed for sleep. (Patient not taking: Reported on 07/15/2014) 30 tablet 0   No current facility-administered medications for this visit.   Facility-Administered Medications Ordered in Other Visits  Medication Dose Route Frequency Provider Last Rate Last Dose  . 0.9 %  sodium chloride infusion   Intravenous Once Eppie Gibson, MD        PHYSICAL EXAMINATION: ECOG PERFORMANCE STATUS: 0 - Asymptomatic  Filed Vitals:   09/19/14 1329  BP: 133/66  Pulse: 78  Temp: 98.3 F (36.8 C)  Resp: 18   Filed Weights   09/19/14 1329  Weight: 170 lb (77.111  kg)    GENERAL:alert, no distress and comfortable SKIN: skin color, texture, turgor are normal, no rashes or significant lesions EYES: normal, Conjunctiva are pink and  non-injected, sclera clear OROPHARYNX:no exudate, no erythema and lips, buccal mucosa, and tongue normal  NECK: Noted lymphedema in his neck. No evidence of new masses.  LYMPH:  no palpable lymphadenopathy in the cervical, axillary or inguinal LUNGS: clear to auscultation and percussion with normal breathing effort HEART: regular rate & rhythm and no murmurs and no lower extremity edema ABDOMEN:abdomen soft, non-tender and normal bowel sounds Musculoskeletal:no cyanosis of digits and no clubbing  NEURO: alert & oriented x 3 with fluent speech, no focal motor/sensory deficits  LABORATORY DATA:  I have reviewed the data as listed    Component Value Date/Time   NA 137 07/15/2014 1150   K 4.1 07/15/2014 1150   CO2 33* 07/15/2014 1150   GLUCOSE 113 07/15/2014 1150   BUN 15.2 07/15/2014 1150   CREATININE 0.7 07/15/2014 1150   CALCIUM 9.1 07/15/2014 1150   PROT 6.5 06/18/2014 1300   ALBUMIN 3.4* 06/18/2014 1300   AST 14 06/18/2014 1300   ALT 12 06/18/2014 1300   ALKPHOS 74 06/18/2014 1300   BILITOT 0.29 06/18/2014 1300    No results found for: SPEP, UPEP  Lab Results  Component Value Date   WBC 3.4* 06/18/2014   NEUTROABS 2.7 06/18/2014   HGB 10.2* 06/18/2014   HCT 29.2* 06/18/2014   MCV 93.3 06/18/2014   PLT 117* 06/18/2014      Chemistry      Component Value Date/Time   NA 137 07/15/2014 1150   K 4.1 07/15/2014 1150   CO2 33* 07/15/2014 1150   BUN 15.2 07/15/2014 1150   CREATININE 0.7 07/15/2014 1150      Component Value Date/Time   CALCIUM 9.1 07/15/2014 1150   ALKPHOS 74 06/18/2014 1300   AST 14 06/18/2014 1300   ALT 12 06/18/2014 1300   BILITOT 0.29 06/18/2014 1300     ASSESSMENT & PLAN:  Cancer of Right Tonsil Clinically, he has complete response to treatment. PET CT scan is ordered for next month. Once we confirm he  has no residual disease, we will get the port removed.    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 This is  slowly improving. He is taking oxycodone for this. I told him to discontinue oxycodone and it should not help with tinnitus. I recommend the patient weaned himself off pain medicine.   Acquired lymphedema He has significant lymphedema from radiation. I recommend referral to physical therapy for management.    No orders of the defined types were placed in this encounter.   All questions were answered. The patient knows to call the clinic with any problems, questions or concerns. No barriers to learning was detected. I spent 15 minutes counseling the patient face to face. The total time spent in the appointment was 20 minutes and more than 50% was on counseling and review of test results     [facility], [place], MD 09/19/2014 2:02 PM

## 2014-09-24 ENCOUNTER — Telehealth: Payer: Self-pay | Admitting: *Deleted

## 2014-09-24 NOTE — Telephone Encounter (Signed)
Provided patient a 2:30 [place] arrival time to Radiation Oncology Waiting for a follow-up lymphedema PT appt with Serafina Royals.  He verbalized understanding.  Gayleen Orem, RN, BSN, [place] at [place] [phone]

## 2014-09-25 ENCOUNTER — Ambulatory Visit: Payer: BLUE CROSS/BLUE SHIELD | Admitting: Nutrition

## 2014-09-25 ENCOUNTER — Ambulatory Visit: Payer: BLUE CROSS/BLUE SHIELD | Attending: Radiation Oncology | Admitting: Physical Therapy

## 2014-09-25 DIAGNOSIS — I890 Lymphedema, not elsewhere classified: Secondary | ICD-10-CM

## 2014-09-25 DIAGNOSIS — R293 Abnormal posture: Secondary | ICD-10-CM | POA: Insufficient documentation

## 2014-09-25 DIAGNOSIS — M436 Torticollis: Secondary | ICD-10-CM | POA: Diagnosis not present

## 2014-09-25 NOTE — Progress Notes (Signed)
Brief follow-up completed with patient in radiation therapy. Patient's weight loss continues and was documented as 170 pounds March 10.  Patient's BMI is 21.82 within normal limits. Patient states he is increasing oral nutrition supplements but is making his own "healthier version"   He "promises to drink twice a day". Patient's feeding tube was removed on March 1.  Patient is very pleased. Dry mouth and mild dysphasia continue. Taste alterations have improved.  Encouraged patient to continue adequate calories and protein to minimize further weight loss.   Provided support and encouragement.   Acknowledged patient's efforts to consume healthy diet.  **Disclaimer: This note was dictated with voice recognition software. Similar sounding words can inadvertently be transcribed and this note may contain transcription errors which may not have been corrected upon publication of note.**

## 2014-09-25 NOTE — Therapy (Signed)
[name], [place], [address] Phone: [phone]   Fax:  [phone]  Physical Therapy Treatment  Patient Details  Name: Leshaun Biebel MRN: [id number] Date of Birth: 1950/07/22 Referring Provider:  Eppie Gibson, MD  Encounter Date: 09/25/2014      PT End of Session - 09/25/14 1505    Visit Number 2   Number of Visits 6   Date for PT Re-Evaluation 11/08/14   PT Start Time 1430   PT Stop Time 1501   PT Time Calculation (min) 31 min   Activity Tolerance Patient tolerated treatment well   Behavior During Therapy [facility] for tasks assessed/performed      Past Medical History  Diagnosis Date  . Depression     "years ago"  . Insomnia 05/30/2014  . History of radiation therapy 05/22/14-07/11/14    tonsils/bilat neck/70 Gy 35 fx, 63 Gy 35 fx to high risk nodal echelons. 56 Gy 35 fx intermediate nodal echelons  . 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 Hernia repair Left 2001    inguinal hernia  . Direct laryngoscopy N/A 04/19/2014    Procedure: DIRECT LARYNGOSCOPY AND BIOPSY;  Surgeon: Jerrell Belfast, MD;  Location: [place];  Service: ENT;  Laterality: N/A;  . Tonsillectomy Right 04/19/2014    Procedure: RIGHT SIDE TONSILLECTOMY;  Surgeon: Jerrell Belfast, MD;  Location: [place];  Service: ENT;  Laterality: Right;    There were no vitals filed for this visit.  Visit Diagnosis:  Abnormal posture - Plan: PT plan of care cert/re-cert  Acquired lymphedema - Plan: PT plan of care cert/re-cert  Stiffness of neck - Plan: PT plan of care cert/re-cert      Subjective Assessment - 09/25/14 1433    Symptoms Fluctuating fullness at anterior neck.   Pertinent History Completed radiation 07/11/14.  Has been walking since then and regaining energy.  Lost 30 lbs. during treatment.   Currently in Pain? No/denies            [facility] PT Assessment  - 09/25/14 0001    Observation/Other Assessments   Observations soft fullness at anterior neck   Posture/Postural Control   Posture/Postural Control Postural limitations   Postural Limitations Forward head   ROM / Strength   AROM / PROM / Strength AROM   AROM   Overall AROM Comments neck extension 25% loss, rotation 25% loss, and sidebend 50% loss           LYMPHEDEMA/ONCOLOGY QUESTIONNAIRE - 09/25/14 1437    Lymphedema Assessments   Lymphedema Assessments Head and Neck   Head and Neck   4 cm superior to sternal notch around neck 40.9 cm   6 cm superior to sternal notch around neck 41.7 cm   8 cm superior to sternal notch around neck 43 cm                        PT Education - 09/25/14 1504    Education provided Yes   Education Details posture exercises: chin and scapular retraction; neck ROM stretches as per head & neck clinic handout; use of foam chip pack for couple hours (not during sleep) for compression   Person(s) Educated Patient   Methods Explanation;Handout   Comprehension Verbalized understanding                [place] - 09/25/14 [phone]  CC Long Term Goal  #1   Title Patient will be independent in performing self-manual lymph drainage for neck swelling.   Time 4   Period Weeks   Status New   CC Long Term Goal  #2   Title Patient will be knowledgeable about how and where to obtain a manufactured neck compression garment.   Time 4   Period Weeks   Status New   CC Long Term Goal  #3   Title Patient will be independent in home exercise program for neck ROM and for general endurance/anti-fatigue and strengthening.   Time 4   Period Weeks   Status New            Plan - 09/25/14 1505    Clinical Impression Statement Patient with mild neck lymphedema s/p radiation treatment for neck cancer will benefit from further education about self-management.   Pt will benefit from skilled therapeutic intervention in order to  improve on the following deficits Increased edema;Decreased range of motion;Decreased knowledge of precautions;Decreased knowledge of use of DME   Rehab Potential Good   PT Frequency 1x / week   PT Duration 2 weeks  to 4 weeks if needed   PT Treatment/Interventions Patient/family education;Therapeutic exercise;ADLs/Self Care Home Management;Manual lymph drainage;Passive range of motion   PT Next Visit Plan perform manual lymph drainage and begin teaching self-manual lymph drainage for neck; show/discuss manufactured compression garments   PT Home Exercise Plan posture exercises, neck AROM, walking program   Consulted and Agree with Plan of Care Patient        Problem List Patient Active Problem List   Diagnosis Date Noted  . Tinnitus of both ears 09/19/2014  . Acquired lymphedema 09/19/2014  . Mucositis due to chemotherapy 06/18/2014  . Leukopenia due to antineoplastic chemotherapy 06/11/2014  . 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 Thrombocytopenia due to drugs 06/11/2014  . Insomnia 05/30/2014  . Bilateral hearing loss 05/30/2014  . S/P gastrostomy tube (G tube) placement, follow-up exam 05/30/2014  . Protein calorie malnutrition 05/30/2014  . Nausea with vomiting 05/30/2014  . Alcoholism 05/08/2014  . Cancer of Right Tonsil 05/07/2014  . Mass of right side of neck 04/19/2014    Scinto,DONNA 09/25/2014, 3:12 PM  [place], [place], [address] Phone: [phone]   Fax:  [place], PT 09/25/2014 3:12 PM

## 2014-10-02 ENCOUNTER — Telehealth: Payer: Self-pay | Admitting: Hematology and Oncology

## 2014-10-02 NOTE — Telephone Encounter (Signed)
returned call and left voicemail for pt regarding to 3.24 cx per pt request....advised for him to call back to r/s

## 2014-10-08 ENCOUNTER — Ambulatory Visit: Payer: BLUE CROSS/BLUE SHIELD | Admitting: Physical Therapy

## 2014-10-08 ENCOUNTER — Ambulatory Visit (HOSPITAL_BASED_OUTPATIENT_CLINIC_OR_DEPARTMENT_OTHER): Payer: BLUE CROSS/BLUE SHIELD

## 2014-10-08 VITALS — BP 120/69 | HR 72 | Temp 98.3°F

## 2014-10-08 DIAGNOSIS — Z452 Encounter for adjustment and management of vascular access device: Secondary | ICD-10-CM

## 2014-10-08 DIAGNOSIS — R293 Abnormal posture: Secondary | ICD-10-CM | POA: Diagnosis not present

## 2014-10-08 DIAGNOSIS — I890 Lymphedema, not elsewhere classified: Secondary | ICD-10-CM

## 2014-10-08 DIAGNOSIS — C099 Malignant neoplasm of tonsil, unspecified: Secondary | ICD-10-CM

## 2014-10-08 DIAGNOSIS — Z95828 Presence of other vascular implants and grafts: Secondary | ICD-10-CM

## 2014-10-08 MED ORDER — HEPARIN SOD (PORK) LOCK FLUSH 100 UNIT/ML IV SOLN
500.0000 [IU] | Freq: Once | INTRAVENOUS | Status: AC
  Administered 2014-10-08: 500 [IU] via INTRAVENOUS
  Filled 2014-10-08: qty 5

## 2014-10-08 MED ORDER — SODIUM CHLORIDE 0.9 % IJ SOLN
10.0000 mL | INTRAMUSCULAR | Status: DC | PRN
  Administered 2014-10-08: 10 mL via INTRAVENOUS
  Filled 2014-10-08: qty 10

## 2014-10-08 NOTE — Patient Instructions (Signed)

## 2014-10-08 NOTE — Therapy (Signed)
[place], [place], [address] Phone: [phone]   Fax:  [phone]  Physical Therapy Treatment  Patient Details  Name: [name] MRN: [id number] Date of Birth: 1951-04-30 Referring Provider:  Eppie Gibson, MD  Encounter Date: 10/08/2014      PT End of Session - 10/08/14 1750    Visit Number 3   Number of Visits 6   PT Start Time 5456  Patient 11 minutes late for appt. today   PT Stop Time 1435   PT Time Calculation (min) 36 min   Activity Tolerance Patient tolerated treatment well   Behavior During Therapy [facility] for tasks assessed/performed      Past Medical History  Diagnosis Date  . Depression     "years ago"  . Insomnia 05/30/2014  . History of radiation therapy 05/22/14-07/11/14    tonsils/bilat neck/70 Gy 35 fx, 63 Gy 35 fx to high risk nodal echelons. 56 Gy 35 fx intermediate nodal echelons  . 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 Hernia repair Left 2001    inguinal hernia  . Direct laryngoscopy N/A 04/19/2014    Procedure: DIRECT LARYNGOSCOPY AND BIOPSY;  Surgeon: Jerrell Belfast, MD;  Location: [place];  Service: ENT;  Laterality: N/A;  . Tonsillectomy Right 04/19/2014    Procedure: RIGHT SIDE TONSILLECTOMY;  Surgeon: Jerrell Belfast, MD;  Location: [place];  Service: ENT;  Laterality: Right;    There were no vitals filed for this visit.  Visit Diagnosis:  Acquired lymphedema      Subjective Assessment - 10/08/14 1359    Symptoms Nothing really new.  Massage works better than chip pack.  Swelling moves sometimes right or more central at neck.   Currently in Pain? No/denies                       [facility] Adult PT Treatment/Exercise - 10/08/14 0001    Manual Therapy   Manual Therapy Manual Lymphatic Drainage (MLD)   Manual Lymphatic Drainage (MLD) Manual lymph drainage performed and taught patient  manual lymph drainage including short neck, shoulder collectors bilat., bilat. axillae, lateral and anterior neck.                PT Education - 10/08/14 1749    Education provided Yes   Education Details self-manual lymph drainage; info about manufactured compression garments    Person(s) Educated Patient   Methods Explanation;Handout  showed pictures of compression garment options and gave info about  availability at [place] Verbalized understanding;Returned demonstration                [facility] Goals - 10/08/14 1753    CC Long Term Goal  #1   Status Achieved   CC Long Term Goal  #2   Status Achieved   CC Long Term Goal  #3   Status Not Met            Plan - 10/08/14 1751    Clinical Impression Statement Patient was engaged and showed good understanding, with verbal and tactile cueing, of self-manual lymph drainage.  He wants to be discharged today, feeling he can manage this and because he will return to work soon.                             Rehab  Potential Good   PT Treatment/Interventions Patient/family education;Manual lymph drainage   PT Next Visit Plan None; patient wants to be discharged as of today.   PT Home Exercise Plan Do self-manual lymph drainage daily.   Recommended Other Services consider purchasing a manufactured compression garment for neck swelling.   Consulted and Agree with Plan of Care Patient        Problem List Patient Active Problem List   Diagnosis Date Noted  . Tinnitus of both ears 09/19/2014  . Acquired lymphedema 09/19/2014  . Mucositis due to chemotherapy 06/18/2014  . Leukopenia due to antineoplastic chemotherapy 06/11/2014  . 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 Thrombocytopenia due to drugs 06/11/2014  . Insomnia 05/30/2014  . Bilateral hearing loss 05/30/2014  . S/P gastrostomy tube (G tube) placement, follow-up exam 05/30/2014  . Protein calorie malnutrition 05/30/2014   . Nausea with vomiting 05/30/2014  . Alcoholism 05/08/2014  . Cancer of Right Tonsil 05/07/2014  . Mass of right side of neck 04/19/2014    Saladin,DONNA 10/08/2014, 5:55 PM  [place], [place], [address] Phone: [phone]   Fax:  [phone]  PHYSICAL THERAPY DISCHARGE SUMMARY  Visits from Start of Care: 3  Current functional level related to goals / functional outcomes: 2 of 3 goals met; see above.   Remaining deficits: Swelling will continue to require management, as will limited neck ROM.   Education / Equipment: Self-manual lymph drainage instruction, neck ROM exercises. Plan: Patient agrees to discharge.  Patient goals were partially met. Patient is being discharged due to the patient's request.  ?????        Bill Yohn, PT 10/08/2014 5:57 PM

## 2014-10-31 ENCOUNTER — Ambulatory Visit (HOSPITAL_COMMUNITY)
Admission: RE | Admit: 2014-10-31 | Discharge: 2014-10-31 | Disposition: A | Discharge status: Home / Self Care | Payer: BLUE CROSS/BLUE SHIELD | Source: Ambulatory Visit | Attending: Radiation Oncology | Admitting: Radiation Oncology

## 2014-10-31 ENCOUNTER — Ambulatory Visit
Admission: RE | Admit: 2014-10-31 | Discharge: 2014-10-31 | Disposition: A | Discharge status: Home / Self Care | Payer: BLUE CROSS/BLUE SHIELD | Source: Ambulatory Visit | Attending: Radiation Oncology | Admitting: Radiation Oncology

## 2014-10-31 DIAGNOSIS — C099 Malignant neoplasm of tonsil, unspecified: Secondary | ICD-10-CM | POA: Diagnosis present

## 2014-10-31 LAB — TSH CHCC: TSH: 1.306 m[IU]/L (ref 0.320–4.118)

## 2014-10-31 LAB — GLUCOSE, CAPILLARY: GLUCOSE-CAPILLARY: 92 mg/dL (ref 70–99)

## 2014-10-31 MED ORDER — FLUDEOXYGLUCOSE F - 18 (FDG) INJECTION
8.5200 | Freq: Once | INTRAVENOUS | Status: AC | PRN
  Administered 2014-10-31: 8.52 via INTRAVENOUS

## 2014-11-01 ENCOUNTER — Telehealth: Payer: Self-pay | Admitting: *Deleted

## 2014-11-01 ENCOUNTER — Ambulatory Visit: Payer: BLUE CROSS/BLUE SHIELD | Admitting: Radiation Oncology

## 2014-11-01 NOTE — Telephone Encounter (Signed)
Per Dr. Isidore Moos, I called patient, reported results of yesterday's PET scan.    He expressed relief and appreciation for good news, thanked me for my call. He confirmed understanding 11:40 11/06/14 follow-up appt with Dr. Candi Leash, RN, BSN, [place] at [place] [phone]

## 2014-11-06 ENCOUNTER — Telehealth: Payer: Self-pay | Admitting: *Deleted

## 2014-11-06 ENCOUNTER — Other Ambulatory Visit: Payer: Self-pay | Admitting: Hematology and Oncology

## 2014-11-06 ENCOUNTER — Ambulatory Visit
Admission: RE | Admit: 2014-11-06 | Discharge: 2014-11-06 | Disposition: A | Discharge status: Home / Self Care | Payer: BLUE CROSS/BLUE SHIELD | Source: Ambulatory Visit | Attending: Radiation Oncology | Admitting: Radiation Oncology

## 2014-11-06 VITALS — BP 115/64 | HR 87 | Temp 98.6°F | Wt 169.4 lb

## 2014-11-06 DIAGNOSIS — C099 Malignant neoplasm of tonsil, unspecified: Secondary | ICD-10-CM

## 2014-11-06 DIAGNOSIS — R635 Abnormal weight gain: Secondary | ICD-10-CM

## 2014-11-06 NOTE — Progress Notes (Signed)
Radiation Oncology         [phone] ________________________________  Name: [name] MRN: [id number]  Date: 11/06/2014  DOB: 01-10-1951  Follow-Up Visit Note  CC: Pcp Not In System  Jerrell Belfast, MD  Diagnosis and Prior Radiotherapy:       ICD-9-CM ICD-10-CM   1. 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 The pt doesn't have any concerns at this time. Pt still using his fluoride trays. Denies a dry mouth. Denies difficulty swallowing. Pt states he is doing great.  Weight changes, if any: weight on 09/19/14 170 lbs.  Nutritional Status - no issues   Swallowing Status: good  Dental (if applicable): When was last visit with dentistry? 06/11/14 with Dr. Enrique Sack, Using fluoride trays daily? Yes   When was last ENT visit?  With Dr. Wilburn Cornelia on 05/05/14. When is next ENT visit? None scheduled  Other notable issues, if any: Pet 10/31/14:Negative PET-CT for residual recurrent tumor/adenopathy in the neck. No findings for metastatic disease.  Pain Status: Denies pain.  Good salivary function   ALLERGIES:  has No Known Allergies.  Meds: No current outpatient prescriptions on file.   No current facility-administered medications for this encounter.   Facility-Administered Medications Ordered in Other Encounters  Medication Dose Route Frequency Provider Last Rate Last Dose  . 0.9 %  sodium chloride infusion   Intravenous Once Eppie Gibson, MD        Physical Findings:  Wt Readings  from Last 3 Encounters:  11/06/14 169 lb 6.4 oz (76.839 kg)  09/19/14 170 lb (77.111 kg)  07/15/14 174 lb 1.6 oz (78.971 kg)    The patient is in no acute distress. Patient is alert and oriented.  weight is 169 lb 6.4 oz (76.839 kg). His temperature is 98.6 F (37 C). His blood pressure is 115/64 and his pulse is 87. His oxygen saturation is 100%.   Oropharyngeal mucosa is intact with no thrush or lesions. No palpable cervical or supraclavicular lymphadenopathy. Skin intact and smooth over neck.  Good salivary function. Skin has healed well over neck. No adenopathy in the neck. Heart has regular rate and rhythm with no murmurs. Chest is clear to auscultation bilaterally. No edema on the legs. Abdomen is soft, non tender, not distended.   Lab Findings: Lab Results  Component Value Date   WBC 3.4* 06/18/2014   HGB 10.2* 06/18/2014   HCT 29.2* 06/18/2014   MCV 93.3 06/18/2014   PLT 117* 06/18/2014   CMP     Component Value Date/Time   NA 137 07/15/2014 1150   K 4.1 07/15/2014 1150   CO2 33* 07/15/2014 1150   GLUCOSE 113 07/15/2014 1150   BUN 15.2 07/15/2014 1150   CREATININE 0.7 07/15/2014 1150   CALCIUM 9.1 07/15/2014 1150   PROT 6.5 06/18/2014 1300   ALBUMIN 3.4* 06/18/2014 1300   AST 14 06/18/2014 1300   ALT 12 06/18/2014 1300   ALKPHOS 74 06/18/2014 1300   BILITOT 0.29  06/18/2014 1300     Lab Results  Component Value Date   TSH 1.306 10/31/2014    Radiographic Findings: Nm Pet Image Restag (ps) Skull Base To Thigh  10/31/2014   CLINICAL DATA:  Subsequent treatment strategy for oral pharyngeal cancer.  EXAM: NUCLEAR MEDICINE PET SKULL BASE TO THIGH  TECHNIQUE: 8.52 mCi F-18 FDG was injected intravenously. Full-ring PET imaging was performed from the skull base to thigh after the radiotracer. CT data was obtained and used for attenuation correction and anatomic localization.  FASTING BLOOD GLUCOSE:  Value: 92 mg/dl  COMPARISON:  PET-CT 03/29/2014  FINDINGS: NECK  No  hypermetabolic lymph nodes in the neck. Resolution of bulky hypermetabolic right-sided neck adenopathy.  CHEST  No hypermetabolic mediastinal or hilar nodes. No suspicious pulmonary nodules on the CT scan.  ABDOMEN/PELVIS  No abnormal hypermetabolic activity within the liver, pancreas, adrenal glands, or spleen. No hypermetabolic lymph nodes in the abdomen or pelvis.  SKELETON  No focal hypermetabolic activity to suggest skeletal metastasis.  IMPRESSION: Negative PET-CT for residual or recurrent tumor/adenopathy in the neck. No findings for metastatic disease.   Electronically Signed   By: Marijo Sanes M.D.   On: 10/31/2014 13:22    Impression/Plan:    1) Head and Neck Cancer Status: Complete response to RT and chemotherapy.  2) Nutritional Status:  - weight: stabilized - PEG tube: removed  3) Risk Factors: The patient has been educated about risk factors including alcohol and tobacco abuse; they understand that avoidance of alcohol and tobacco is important to prevent recurrences as well as other cancers. Prior heavy ETOH. Abstaining.  4) Swallowing: no issues  5) Dental: Encouraged to continue regular followup with dentistry, and dental hygiene including fluoride rinses. Follow up with Dr. Enrique Sack because he has not seen the pt since December 2015. I sent a note to dentistry  6) Thyroid function: Will recheck the thyroid within 6-12 months.  Lab Results  Component Value Date   TSH 1.306 10/31/2014    7) Social: no active issues.  8) Other: Will order for port-a-cath removal.  9) Gave a card to the pt to follow up in 6 months. Follow up with Dr. Wilburn Cornelia in 3 months. Gayleen Orem, RN, our Head and Neck Oncology Navigator will contact ENT scheduling  This document serves as a record of services personally performed by Eppie Gibson, MD. It was created on her behalf by Darcus Austin, a trained medical scribe. The creation of this record is based on the scribe's personal observations and the  provider's statements to them. This document has been checked and approved by the attending provider.      _____________________________________   Eppie Gibson, MD

## 2014-11-06 NOTE — Telephone Encounter (Signed)
CALLED PATIENT TO INFORM OF LAB AND FU, LVM FOR A RETURN CALL

## 2014-11-07 ENCOUNTER — Other Ambulatory Visit: Payer: Self-pay | Admitting: Radiology

## 2014-11-07 NOTE — Patient Instructions (Signed)
Patient instructed to arrive in x-ray at 0730.  Patient wants port removal without moderate sedation.  Patient denies blood thinners.

## 2014-11-08 ENCOUNTER — Telehealth: Payer: Self-pay | Admitting: *Deleted

## 2014-11-08 ENCOUNTER — Telehealth (HOSPITAL_COMMUNITY): Payer: Self-pay

## 2014-11-08 ENCOUNTER — Telehealth (HOSPITAL_COMMUNITY): Payer: Self-pay | Admitting: Radiology

## 2014-11-08 NOTE — Telephone Encounter (Signed)
Spoke with [name] at [facility] ENT, requested per Dr. Isidore Moos, that appt be made for patient to see Dr. Wilburn Cornelia in 3 months for routine follow-up.  She verbalized understanding.  Gayleen Orem, RN, BSN, [place] at [place] [phone]

## 2014-11-08 NOTE — Telephone Encounter (Signed)
Rec'd phone call from patient to cancel portacath removal appointment scheduled for Monday 11/11/14.  Patient will call back when he is ready to reschedule.  "I just started back to work this week and am too busy now."

## 2014-11-08 NOTE — Telephone Encounter (Signed)
11/08/14            Patient called and cancelled appt. for S/P XRT CK w/Dr. Enrique Sack on 11/12/14 @ 9:00. Patient unable to keep appt. due to work schedule.  Patient will call back to reschedule appt.   LRI

## 2014-11-11 ENCOUNTER — Inpatient Hospital Stay (HOSPITAL_COMMUNITY)
Admission: RE | Admit: 2014-11-11 | Discharge: 2014-11-11 | Disposition: A | Discharge status: Home / Self Care | Payer: Self-pay | Source: Ambulatory Visit | Attending: Hematology and Oncology | Admitting: Hematology and Oncology

## 2014-11-11 ENCOUNTER — Ambulatory Visit (HOSPITAL_COMMUNITY): Payer: Self-pay

## 2014-11-12 ENCOUNTER — Ambulatory Visit (HOSPITAL_COMMUNITY): Payer: Self-pay | Admitting: Dentistry

## 2014-11-14 ENCOUNTER — Other Ambulatory Visit: Payer: Self-pay | Admitting: Radiology

## 2014-11-15 ENCOUNTER — Encounter (HOSPITAL_COMMUNITY): Payer: Self-pay

## 2014-11-15 ENCOUNTER — Ambulatory Visit (HOSPITAL_COMMUNITY)
Admission: RE | Admit: 2014-11-15 | Discharge: 2014-11-15 | Disposition: A | Discharge status: Home / Self Care | Payer: BLUE CROSS/BLUE SHIELD | Source: Ambulatory Visit | Attending: Hematology and Oncology | Admitting: Hematology and Oncology

## 2014-11-15 DIAGNOSIS — C099 Malignant neoplasm of tonsil, unspecified: Secondary | ICD-10-CM | POA: Diagnosis not present

## 2014-11-15 MED ORDER — CEFAZOLIN SODIUM-DEXTROSE 2-3 GM-% IV SOLR
INTRAVENOUS | Status: AC
Start: 2014-11-15 — End: 2014-11-15
  Administered 2014-11-15: 2 g via INTRAVENOUS
  Filled 2014-11-15: qty 50

## 2014-11-15 MED ORDER — LIDOCAINE-EPINEPHRINE 2 %-1:100000 IJ SOLN
INTRAMUSCULAR | Status: AC
  Filled 2014-11-15: qty 1

## 2014-11-15 MED ORDER — LIDOCAINE HCL 1 % IJ SOLN
INTRAMUSCULAR | Status: AC
  Filled 2014-11-15: qty 20

## 2014-11-15 MED ORDER — CEFAZOLIN SODIUM-DEXTROSE 2-3 GM-% IV SOLR
2.0000 g | Freq: Once | INTRAVENOUS | Status: AC
  Administered 2014-11-15: 2 g via INTRAVENOUS

## 2014-11-15 MED ORDER — SODIUM CHLORIDE 0.9 % IV SOLN
Freq: Once | INTRAVENOUS | Status: AC
  Administered 2014-11-15: 14:00:00 via INTRAVENOUS

## 2014-11-15 NOTE — Progress Notes (Signed)
Pt arrived today for his PAC removal. Spoke with IR to ask if labs are to be drawn. Pt denies any fever and is currently afebrile and is not taking any blood thinners so labs were canceled as ordered

## 2014-11-15 NOTE — Procedures (Signed)
Procedure:  Port removal Findings:  Right chest port removed in entirety without complication. Wound closed with subcutaneous 3-0 Monocryl and 4-0 subcuticular Vicryl.  Dermabond applied. No complications EBL < 10 mL  Milyn Stapleton T. Kathlene Cote, M.D. Pager:  [phone]

## 2014-11-15 NOTE — Discharge Instructions (Signed)
Incision Care °An incision is when a surgeon cuts into your body tissues. After surgery, the incision needs to be cared for properly to prevent infection.  °HOME CARE INSTRUCTIONS  °· Take all medicine as directed by your caregiver. Only take over-the-counter or prescription medicines for pain, discomfort, or fever as directed by your caregiver. °· Do not remove your bandage (dressing) or get your incision wet until your surgeon gives you permission. In the event that your dressing becomes wet, dirty, or starts to smell, change the dressing and call your surgeon for instructions as soon as possible. °· Take showers. Do not take tub baths, swim, or do anything that may soak the wound until it is healed. °· Resume your normal diet and activities as directed or allowed. °· Avoid lifting any weight until you are instructed otherwise. °· Use anti-itch antihistamine medicine as directed by your caregiver. The wound may itch when it is healing. Do not pick or scratch at the wound. °· Follow up with your caregiver for stitch (suture) or staple removal as directed. °· Drink enough fluids to keep your urine clear or pale yellow. °SEEK MEDICAL CARE IF:  °· You have redness, swelling, or increasing pain in the wound that is not controlled with medicine. °· You have drainage, blood, or pus coming from the wound that lasts longer than 1 day. °· You develop muscle aches, chills, or a general ill feeling. °· You notice a bad smell coming from the wound or dressing. °· Your wound edges separate after the sutures, staples, or skin adhesive strips have been removed. °· You develop persistent nausea or vomiting. °SEEK IMMEDIATE MEDICAL CARE IF:  °· You have a fever. °· You develop a rash. °· You develop dizzy episodes or faint while standing. °· You have difficulty breathing. °· You develop any reaction or side effects to medicine given. °MAKE SURE YOU:  °· Understand these instructions. °· Will watch your condition. °· Will get help  right away if you are not doing well or get worse. °Document Released: 01/15/2005 Document Revised: 09/20/2011 Document Reviewed: 08/22/2013 °ExitCare® Patient Information ©2015 [employer]. This information is not intended to replace advice given to you by your health care provider. Make sure you discuss any questions you have with your health care provider. ° ° °

## 2015-01-07 ENCOUNTER — Telehealth: Payer: Self-pay | Admitting: *Deleted

## 2015-01-07 NOTE — Telephone Encounter (Signed)
Oncology Nurse Navigator Documentation  Oncology Nurse Navigator Flowsheets 01/07/2015  Navigator Encounter Type Telephone;6 month  Called patient to check on well-being since finishing tmts in December.  LVM asking him to return my call.  Time Spent with Patient [employer], RN, BSN, [place] at [place] [phone]

## 2015-01-08 ENCOUNTER — Telehealth: Payer: Self-pay | Admitting: *Deleted

## 2015-01-09 NOTE — Telephone Encounter (Signed)
Oncology Nurse Navigator Documentation  Oncology Nurse Navigator Flowsheets 01/08/2015  Navigator Encounter Type Telephone;6 month  Patient returned my VM. He reported:  Full-time work is going well.  Physically and mentally, he feels better now than he has every felt.  The only SE remaining is dry mouth but he indicated that salivary activity is improving.  He is eating/drinking well, gaining weight.  I discussed the upcoming H&N FYNN planned for this fall, encouraged him to attend, indicated additional information forthcoming. He did not express any needs or concerns at this time, I encouraged him to contact me if that changes, he verbalized understanding.   Time Spent with Patient [place], RN, BSN, [place] at [facility] [phone]

## 2015-05-07 NOTE — Progress Notes (Signed)
Mr. [name] is here for follow-up of radiation to his Right tonsil area completed 07/11/2014.  Pain issues, if any: None Using a feeding tube?: No, it has been removed Weight changes, if any:  Wt Readings from Last 3 Encounters:  05/09/15 174 lb 1.6 oz (78.971 kg)  11/15/14 170 lb (77.111 kg)  11/06/14 169 lb 6.4 oz (76.839 kg)   Swallowing issues, if any: He reports he eats a soft diet, and needs to have water with food to help it go down his throat better. He denies pain in his throat while swallowing.  Smoking or chewing tobacco? No Using fluoride trays daily? Yes, 2-3 times a week.  Last ENT visit was on: He reports he has not seen Dr. Wilburn Cornelia since his original diagnosis.   Other notable issues, if any: He reports he still has some ringing in his ears, but it has decreased since treatment

## 2015-05-08 ENCOUNTER — Ambulatory Visit: Payer: BLUE CROSS/BLUE SHIELD

## 2015-05-09 ENCOUNTER — Ambulatory Visit
Admission: RE | Admit: 2015-05-09 | Discharge: 2015-05-09 | Disposition: A | Discharge status: Home / Self Care | Payer: BLUE CROSS/BLUE SHIELD | Source: Ambulatory Visit | Attending: Radiation Oncology | Admitting: Radiation Oncology

## 2015-05-09 ENCOUNTER — Encounter: Payer: Self-pay | Admitting: *Deleted

## 2015-05-09 ENCOUNTER — Telehealth: Payer: Self-pay | Admitting: *Deleted

## 2015-05-09 ENCOUNTER — Other Ambulatory Visit: Payer: Self-pay | Admitting: Adult Health

## 2015-05-09 ENCOUNTER — Encounter: Payer: Self-pay | Admitting: Adult Health

## 2015-05-09 ENCOUNTER — Ambulatory Visit (HOSPITAL_BASED_OUTPATIENT_CLINIC_OR_DEPARTMENT_OTHER)
Admission: RE | Admit: 2015-05-09 | Discharge: 2015-05-09 | Disposition: A | Discharge status: Home / Self Care | Payer: BLUE CROSS/BLUE SHIELD | Source: Ambulatory Visit | Attending: Radiation Oncology | Admitting: Radiation Oncology

## 2015-05-09 ENCOUNTER — Encounter: Payer: Self-pay | Admitting: Radiation Oncology

## 2015-05-09 VITALS — BP 122/65 | HR 60 | Temp 98.4°F | Ht 74.0 in | Wt 174.1 lb

## 2015-05-09 DIAGNOSIS — C099 Malignant neoplasm of tonsil, unspecified: Secondary | ICD-10-CM | POA: Diagnosis not present

## 2015-05-09 DIAGNOSIS — R635 Abnormal weight gain: Secondary | ICD-10-CM

## 2015-05-09 DIAGNOSIS — Z9189 Other specified personal risk factors, not elsewhere classified: Secondary | ICD-10-CM

## 2015-05-09 LAB — TSH CHCC: TSH: 1.559 m[IU]/L (ref 0.320–4.118)

## 2015-05-09 NOTE — Progress Notes (Signed)
Radiation Oncology         [phone] ________________________________  Name: [name] MRN: [id number]  Date: 05/09/2015  DOB: 03/20/1951  Follow-Up Visit Note  CC: Pcp Not In System  Jerrell Belfast, MD  Diagnosis and Prior Radiotherapy:       ICD-9-CM ICD-10-CM   1. 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 The patient feels well and states, "This is the best I've felt in a long time."  Pain issues, if any: None  Using a feeding tube?: No, it has been removed. Weight changes, if any:  Wt Readings from Last 3 Encounters:   05/09/15  174 lb 1.6 oz (78.971 kg)   11/15/14  170 lb (77.111 kg)   11/06/14  169 lb 6.4 oz (76.839 kg)    Swallowing issues, if any: He reports he eats a soft foods diet and needs to have water with food to help it go down his throat better. He denies pain in his throat while swallowing.  Smoking or chewing tobacco? No  Using fluoride trays daily? Yes, 2-3 times a week. He reports seeing the Dentist in May. Last ENT visit was on: He reports he has not seen Dr. Wilburn Cornelia since his original diagnosis.  Other notable issues, if any: He reports he still has some ringing in his ears, but this has decreased since treatment  Present during this encounter is Gayleen Orem, RN, our Head and Neck Oncology Navigator, and our survivorship navigator, Mike Craze, NP.  ALLERGIES:  has No Known Allergies.  Meds: No current outpatient prescriptions on file.   No current facility-administered medications for this  encounter.   Facility-Administered Medications Ordered in Other Encounters  Medication Dose Route Frequency Provider Last Rate Last Dose  . 0.9 %  sodium chloride infusion   Intravenous Once Eppie Gibson, MD        Physical Findings:  Wt Readings from Last 3 Encounters:  05/09/15 174 lb 1.6 oz (78.971 kg)  11/15/14 170 lb (77.111 kg)  11/06/14 169 lb 6.4 oz (76.839 kg)    The patient is in no acute distress. Patient is alert and oriented.  height is 6\' 2"  (1.88 m) and weight is 174 lb 1.6 oz (78.971 kg). His temperature is 98.4 F (36.9 C). His blood pressure is 122/65 and his pulse is 60.   HEENT Oropharynx is clear with no thrush.  No palpable masses in the proximal base of the tongue. Neck No palpable lymphadenopathy in the neck.  Lungs are clear to auscultation bilaterally.  Heart has regular rate and rhythm.  Psych: affect appropriate, judgment intact  Lab Findings: Lab Results  Component Value Date   WBC 3.4* 06/18/2014   HGB 10.2* 06/18/2014   HCT 29.2* 06/18/2014   MCV 93.3 06/18/2014   PLT 117* 06/18/2014   CMP     Component Value Date/Time   NA 137 07/15/2014 1150   K 4.1 07/15/2014 1150   CO2 33* 07/15/2014 1150   GLUCOSE 113 07/15/2014 1150   BUN 15.2  07/15/2014 1150   CREATININE 0.7 07/15/2014 1150   CALCIUM 9.1 07/15/2014 1150   PROT 6.5 06/18/2014 1300   ALBUMIN 3.4* 06/18/2014 1300   AST 14 06/18/2014 1300   ALT 12 06/18/2014 1300   ALKPHOS 74 06/18/2014 1300   BILITOT 0.29 06/18/2014 1300    Lab Results  Component Value Date   TSH 1.559 05/09/2015    Radiographic Findings: No results found.  Impression/Plan:    1) Head and Neck Cancer Status: NED  2) Nutritional Status: no issues  3) Risk Factors: The patient has been educated about risk factors including alcohol and tobacco abuse; they understand that avoidance of alcohol and tobacco is important to prevent recurrences as well as other cancers. Prior heavy ETOH. Abstaining.  4)  Swallowing: No issues  5) Dental: Encouraged to continue regular followup with dentistry q 4 mo , and dental hygiene including daily fluoride rinses.  6) Thyroid function: normal today  Lab Results  Component Value Date   TSH 1.559 05/09/2015   7) Social: No active issues. History of depression.   8) The patient was given a "Life After Cancer for Every Survivor" booklet and our survivorship navigator, Mike Craze, NP, gave the patient her contact information.  9) Follow up with Dr. Wilburn Cornelia in approximately 2 months. Gayleen Orem, RN, our Head and Neck Oncology Navigator will contact ENT scheduling. In the meantime, the patient will f/u with Mike Craze, NP in 6 months. She will then make sure the patient will see Dr. Wilburn Cornelia in 3-4 months thereafter and myself 6-8 months after her appointment with Mr Sar.  10) He reports his pharmacy is the [facility] on [address] [place], [place] [address].  This document serves as a record of services personally performed by Eppie Gibson, MD. It was created on her behalf by Darcus Austin, a trained medical scribe. The creation of this record is based on the scribe's personal observations and the provider's statements to them. This document has been checked and approved by the attending provider.      _____________________________________   Eppie Gibson, MD

## 2015-05-09 NOTE — Progress Notes (Signed)
  Oncology Nurse Navigator Documentation   Navigator Encounter Type: Clinic/MDC (05/09/15 1155) Patient Visit Type: Follow-up (05/09/15 1155)       Met with patient during routine f/u appt with Dr. Isidore Moos.  It has been 10 months since he completed RT. He reported:  "Doing real well"; eating better, getting regular exercise.  No complaints/concerns. He expressed interest in attending the next H&N FYNN class ("just wasn't ready before") and serving as a Product manager.  I noted I would follow-up with him on both accounts. He understands he can contact me.  Gayleen Orem, RN, BSN, [place] [facility] [place] [phone]                  Time Spent with Patient: 30 (05/09/15 1155)

## 2015-05-09 NOTE — Telephone Encounter (Signed)
  Oncology Nurse Navigator Documentation   Navigator Encounter Type: Telephone (05/09/15 1325)         Interventions: Coordination of Care (05/09/15 1325)     Per Dr. Pearlie Oyster guidance, called [facility] to arrange follow-up visit.  Spoke with Michel Bickers, requested that patient be contacted and appt arranged to see Dr. Wilburn Cornelia in 2 months.  She verbalized understanding.  Gayleen Orem, RN, BSN, [place] [facility] [place] [phone]            Time Spent with Patient: 15 (05/09/15 1325)

## 2015-05-09 NOTE — Progress Notes (Signed)
I briefly met [name] today during his radiation oncology visit with Dr. Isidore Moos. [name] completed radiation therapy for cancer of the right tonsil on 07/11/14 and is clinically without evidence of disease today. Dr. Isidore Moos spoke with the patient about having subsequent visits alternating with myself and Dr. Isidore Moos for continued surveillance and follow-up care. [name] agreed. I discussed with him the role of survivorship and my role in his post-treatment cancer care. I gave him a copy of the "Life After Cancer for Every Survivor" booklet, along with my business card and encouraged him to call me with any questions or concerns.   I will see him in 6 months with TSH. He was encouraged to see his ENT physician Dr. Claiborne Rigg in 3 months.  [name] survivorship/surveillance appointment with me was scheduled and patient prefers to receive notification of appointments via [facility].I sent him a message re: his survivorship appointment. I encouraged him to call me with any concerns and we could certainly see him sooner, if needed. I look forward to participating in his care.   Mike Craze, NP [place] [phone]

## 2015-05-19 IMAGING — PT NM PET TUM IMG INITIAL (PI) SKULL BASE T - THIGH
1 of 8 series · 1 of 25 positions shown · non-contrast
Comparison: No priors.

CLINICAL DATA: Initial treatment strategy for lymphoma.

EXAM:
NUCLEAR MEDICINE PET SKULL BASE TO THIGH
TECHNIQUE: 9.7 mCi F-18 FDG was injected intravenously. Full-ring PET imaging
was performed from the skull base to thigh after the radiotracer. CT
data was obtained and used for attenuation correction and anatomic
localization.
FASTING BLOOD GLUCOSE:  Value: 89 mg/dl

[Series 4: ct sk_thigh 5.0 b31f · axial · 5.0mm · 0.98mm/px · 1 of 221 slices shown]
[im 221/221  brain]
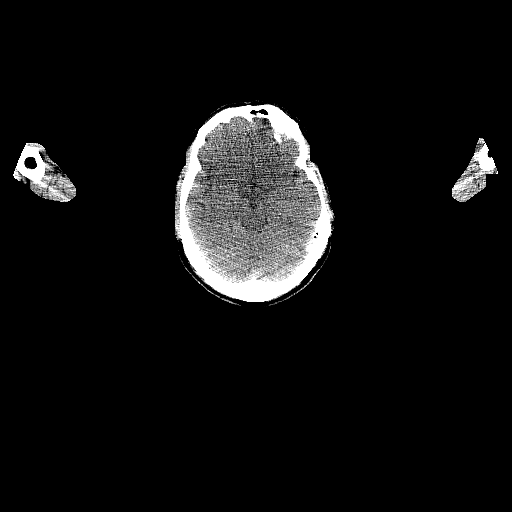

[1 of 25 positions shown; findings below may reference images not displayed]

FINDINGS: NECK

There is extensive lymphadenopathy in the right cervical chain,
largest of which is a 4.5 x 4.0 cm right level IIa lymph node which
is markedly hypermetabolic (SUVmax = 16.6). Multiple other enlarged
right-sided hypermetabolic lymph nodes are noted, predominantly in
level II nodes. In addition, there is some low level symmetric
hypermetabolism in the region of the pharyngeal tonsils bilaterally,
which may be physiologic.

CHEST

No hypermetabolic mediastinal or hilar nodes. No suspicious
pulmonary nodules on the CT scan.

ABDOMEN/PELVIS

No abnormal hypermetabolic activity within the liver, pancreas,
adrenal glands, or spleen. No hypermetabolic lymph nodes in the
abdomen or pelvis. Atherosclerotic calcifications in the abdominal
and pelvic vasculature, without evidence of aneurysm. Numerous
colonic diverticulae are noted, without surrounding inflammatory
changes to suggest an acute diverticulitis at this time. Normal
appendix.

SKELETON

No focal hypermetabolic activity to suggest skeletal metastasis.
IMPRESSION: 1. Extensive right cervical chain lymphadenopathy with malignant
range hypermetabolism, as above, compatible with the reported
clinical history of lymphoma.
2. There is also overall symmetric low-level hypermetabolism in the
region of the pharyngeal tonsils bilaterally. This is favored to be
physiologic, but additional lymphomatous involvement in this region
is not excluded.

## 2015-06-12 ENCOUNTER — Ambulatory Visit (HOSPITAL_COMMUNITY): Payer: Self-pay | Admitting: Dentistry

## 2015-11-03 ENCOUNTER — Telehealth: Payer: Self-pay | Admitting: *Deleted

## 2015-11-03 ENCOUNTER — Telehealth: Payer: Self-pay | Admitting: Hematology and Oncology

## 2015-11-03 NOTE — Telephone Encounter (Signed)
  Oncology Nurse Navigator Documentation  Navigator Location: [facility] (11/03/15 1746) Navigator Encounter Type: Telephone (11/03/15 1746) Telephone: Outgoing Call (11/03/15 1746)                 Interventions: Coordination of Care (11/03/15 1746)     I called [name] in follow-up to notification that he cancelled lab and survivorship appts for this Friday.  LVMM asking him to return my call to provide clarification.  Gayleen Orem, RN, BSN, [place] at [place] [phone]                     Time Spent with Patient: 15 (11/03/15 1746)

## 2015-11-03 NOTE — Telephone Encounter (Signed)
Patient called to cx 4/28 appointment for lab/ with Elzie Rings and does not wish to reschedule. Message to Young Harris and NG.

## 2015-11-07 ENCOUNTER — Encounter: Payer: Self-pay | Admitting: Adult Health

## 2015-11-07 ENCOUNTER — Other Ambulatory Visit: Payer: Self-pay

## 2016-09-03 DIAGNOSIS — Z125 Encounter for screening for malignant neoplasm of prostate: Secondary | ICD-10-CM | POA: Diagnosis not present

## 2016-09-03 DIAGNOSIS — F5101 Primary insomnia: Secondary | ICD-10-CM | POA: Diagnosis not present

## 2016-09-03 DIAGNOSIS — E7800 Pure hypercholesterolemia, unspecified: Secondary | ICD-10-CM | POA: Diagnosis not present

## 2016-09-03 DIAGNOSIS — Z0000 Encounter for general adult medical examination without abnormal findings: Secondary | ICD-10-CM | POA: Diagnosis not present

## 2016-10-19 DIAGNOSIS — Z85810 Personal history of malignant neoplasm of tongue: Secondary | ICD-10-CM | POA: Diagnosis not present

## 2016-10-19 DIAGNOSIS — M542 Cervicalgia: Secondary | ICD-10-CM | POA: Diagnosis not present

## 2016-11-03 ENCOUNTER — Other Ambulatory Visit: Payer: Self-pay | Admitting: Otolaryngology

## 2016-11-03 DIAGNOSIS — M542 Cervicalgia: Secondary | ICD-10-CM

## 2016-11-09 ENCOUNTER — Ambulatory Visit
Admission: RE | Admit: 2016-11-09 | Discharge: 2016-11-09 | Disposition: A | Discharge status: Home / Self Care | Payer: BLUE CROSS/BLUE SHIELD | Source: Ambulatory Visit | Attending: Otolaryngology | Admitting: Otolaryngology

## 2016-11-09 DIAGNOSIS — M542 Cervicalgia: Secondary | ICD-10-CM | POA: Diagnosis not present

## 2016-11-22 ENCOUNTER — Telehealth: Payer: Self-pay | Admitting: *Deleted

## 2016-11-22 NOTE — Telephone Encounter (Signed)
A user error has taken place: encounter opened in error, closed for administrative reasons.

## 2017-01-18 ENCOUNTER — Telehealth: Payer: Self-pay | Admitting: *Deleted

## 2017-01-18 NOTE — Telephone Encounter (Signed)
A user error has taken place: encounter opened in error, closed for administrative reasons.

## 2017-08-04 DIAGNOSIS — Z1211 Encounter for screening for malignant neoplasm of colon: Secondary | ICD-10-CM | POA: Diagnosis not present

## 2017-08-04 DIAGNOSIS — Z01818 Encounter for other preprocedural examination: Secondary | ICD-10-CM | POA: Diagnosis not present

## 2017-08-04 DIAGNOSIS — Z1159 Encounter for screening for other viral diseases: Secondary | ICD-10-CM | POA: Diagnosis not present

## 2017-08-16 DIAGNOSIS — Z800 Family history of malignant neoplasm of digestive organs: Secondary | ICD-10-CM | POA: Diagnosis not present

## 2017-08-16 DIAGNOSIS — K5730 Diverticulosis of large intestine without perforation or abscess without bleeding: Secondary | ICD-10-CM | POA: Diagnosis not present

## 2017-08-16 DIAGNOSIS — K648 Other hemorrhoids: Secondary | ICD-10-CM | POA: Diagnosis not present

## 2017-08-16 DIAGNOSIS — D126 Benign neoplasm of colon, unspecified: Secondary | ICD-10-CM | POA: Diagnosis not present

## 2017-08-16 DIAGNOSIS — Z1211 Encounter for screening for malignant neoplasm of colon: Secondary | ICD-10-CM | POA: Diagnosis not present

## 2017-08-16 DIAGNOSIS — K635 Polyp of colon: Secondary | ICD-10-CM | POA: Diagnosis not present

## 2017-08-18 DIAGNOSIS — K635 Polyp of colon: Secondary | ICD-10-CM | POA: Diagnosis not present

## 2017-08-18 DIAGNOSIS — D126 Benign neoplasm of colon, unspecified: Secondary | ICD-10-CM | POA: Diagnosis not present

## 2017-08-18 DIAGNOSIS — Z1211 Encounter for screening for malignant neoplasm of colon: Secondary | ICD-10-CM | POA: Diagnosis not present

## 2017-09-07 DIAGNOSIS — Z125 Encounter for screening for malignant neoplasm of prostate: Secondary | ICD-10-CM | POA: Diagnosis not present

## 2017-09-07 DIAGNOSIS — E786 Lipoprotein deficiency: Secondary | ICD-10-CM | POA: Diagnosis not present

## 2017-09-07 DIAGNOSIS — Z0000 Encounter for general adult medical examination without abnormal findings: Secondary | ICD-10-CM | POA: Diagnosis not present
